# Patient Record
Sex: Male | Born: 1970 | Race: White | Hispanic: No | Marital: Single | Smoking: Never smoker
Health system: Southern US, Community
[De-identification: ages and names within clinical notes are randomized; demographics above are authoritative.]

## PROBLEM LIST (undated history)

## (undated) DIAGNOSIS — R7303 Prediabetes: Secondary | ICD-10-CM

## (undated) DIAGNOSIS — G4730 Sleep apnea, unspecified: Secondary | ICD-10-CM

## (undated) DIAGNOSIS — G2581 Restless legs syndrome: Secondary | ICD-10-CM

## (undated) DIAGNOSIS — R072 Precordial pain: Secondary | ICD-10-CM

## (undated) DIAGNOSIS — F419 Anxiety disorder, unspecified: Secondary | ICD-10-CM

## (undated) DIAGNOSIS — R002 Palpitations: Secondary | ICD-10-CM

## (undated) DIAGNOSIS — I10 Essential (primary) hypertension: Secondary | ICD-10-CM

## (undated) DIAGNOSIS — N401 Enlarged prostate with lower urinary tract symptoms: Secondary | ICD-10-CM

## (undated) DIAGNOSIS — Z87442 Personal history of urinary calculi: Secondary | ICD-10-CM

## (undated) DIAGNOSIS — M7522 Bicipital tendinitis, left shoulder: Secondary | ICD-10-CM

## (undated) DIAGNOSIS — F329 Major depressive disorder, single episode, unspecified: Secondary | ICD-10-CM

## (undated) DIAGNOSIS — F32A Depression, unspecified: Secondary | ICD-10-CM

## (undated) DIAGNOSIS — M1990 Unspecified osteoarthritis, unspecified site: Secondary | ICD-10-CM

## (undated) DIAGNOSIS — M75102 Unspecified rotator cuff tear or rupture of left shoulder, not specified as traumatic: Secondary | ICD-10-CM

## (undated) DIAGNOSIS — G4733 Obstructive sleep apnea (adult) (pediatric): Secondary | ICD-10-CM

## (undated) HISTORY — PX: HIP ARTHROSCOPY: SUR88

## (undated) HISTORY — PX: LITHOTRIPSY: SUR834

## (undated) HISTORY — PX: WISDOM TOOTH EXTRACTION: SHX21

## (undated) HISTORY — PX: EXTRACORPOREAL SHOCK WAVE LITHOTRIPSY: SHX1557

---

## 2001-06-17 ENCOUNTER — Emergency Department (HOSPITAL_COMMUNITY): Admission: EM | Admit: 2001-06-17 | Discharge: 2001-06-17 | Payer: Self-pay | Admitting: *Deleted

## 2001-06-17 ENCOUNTER — Encounter: Payer: Self-pay | Admitting: Emergency Medicine

## 2004-07-04 ENCOUNTER — Ambulatory Visit: Payer: Self-pay | Admitting: Internal Medicine

## 2014-06-13 NOTE — H&P (Signed)
  [facility] [address]   [address] [place], [place] [id number] [phone] A Division of [facility]  Loreta Aveaniel F. Charmel Pronovost, M.D.   Robert A. Thurston HoleWainer, M.D.   Burnell BlanksW. Dan Caffrey, M.D.   Eulas PostJoshua P. Landau, M.D.   Lunette StandsAnna Voytek, M.D Jewel Baizeimothy D. Eulah PontMurphy, M.D.  Buford DresserWesley R. Ibazebo, M.D.  Estell HarpinJames S. Kramer, M.D.    Melina Fiddlerebecca S. Bassett, M.D. Mary L. Isidoro DonningAnton, PA-C  Kirstin A. Shepperson, PA-C  Josh Brodheadhadwell, PA-C [place] Parry, [place]A-C   RE: [name] SlaterHollis, [name]   [id number]      DOB: 1971-03-06 PROGRESS NOTE: 06-06-14 Reason for visit:  Evaluation of right hip arthritis. History of present illness: This is a 43 year old gentleman who has had pain in his right hip for quite a while. He had an arthroscopy 10+ years ago. He has had multiple ultrasound guided injections to his right hip which helped but the last one did not provide much relief. He originally took Celebrex which worked well but he developed a rash with Celebrex and then tried Mobic and also got a rash with this.  He is unable to put on his socks or tie his shoes at this point. Please see associated documentation for this clinic visit for further past medical, family, surgical and social history, review of systems, and exam findings as this was reviewed by me.  EXAMINATION: Well appearing male in no apparent distress. Right lower extremity has significantly decreased range of motion positive Stinchfield and negative straight leg raise. He is neurovascularly intact.  IMAGING: Previous x-rays reviewed by me demonstrate severe joint space narrowing of the right hip, moderate of the left hip.     ASSESSMENT/PLAN: He has bilateral hip arthritis, left is asymptomatic, right has been recalcitrant to multiple injections. He's unable to take NSAID's. I had a very lengthy discussion with him of the risks and benefits of total hip arthroplasty he would like to think about this. He will follow-up as  needed.  Jewel Baizeimothy D.  Eulah PontMurphy, M.D.  Electronically verified by Jewel Baizeimothy D. Eulah PontMurphy, M.D. TDM:kah D 06-07-14 T 06-08-14

## 2014-06-21 NOTE — Pre-Procedure Instructions (Signed)
Arliss JourneySteven S Prestia  06/21/2014   Your procedure is scheduled on:  Tuesday July 05, 2014 at 2:00 PM.  [facility] 12:00 PM.  Call this number if you have problems the morning of surgery: [phone]   Call this number if you have any questions prior to surgery: [phone]   Remember:   Do not eat food or drink liquids after midnight.   Take these medicines the morning of surgery with A SIP OF WATER: NONE   Do not wear jewelry.  Do not wear lotions, powders, or cologne.   Men may shave face and neck.  Do not bring valuables to the hospital.  [facility] is not responsible for any belongings or valuables.               Contacts, dentures or bridgework may not be worn into surgery.  Leave suitcase in the car. After surgery it may be brought to your room.  For patients admitted to the hospital, discharge time is determined by your treatment team.               Patients discharged the day of surgery will not be allowed to drive home.  Name and phone number of your driver:   Special Instructions: Shower using CHG soap the night before and the morning of your surgery   Please read over the following fact sheets that you were given: Pain Booklet, Coughing and Deep Breathing, Blood Transfusion Information, Total Joint Packet, MRSA Information and Surgical Site Infection Prevention

## 2014-06-22 ENCOUNTER — Encounter (HOSPITAL_COMMUNITY)
Admission: RE | Admit: 2014-06-22 | Discharge: 2014-06-22 | Disposition: A | Discharge status: Home / Self Care | Payer: BC Managed Care – PPO | Source: Ambulatory Visit | Attending: Orthopedic Surgery | Admitting: Orthopedic Surgery

## 2014-06-22 ENCOUNTER — Encounter (HOSPITAL_COMMUNITY): Payer: Self-pay

## 2014-06-22 DIAGNOSIS — I10 Essential (primary) hypertension: Secondary | ICD-10-CM | POA: Insufficient documentation

## 2014-06-22 DIAGNOSIS — G4730 Sleep apnea, unspecified: Secondary | ICD-10-CM | POA: Diagnosis not present

## 2014-06-22 DIAGNOSIS — Z01818 Encounter for other preprocedural examination: Secondary | ICD-10-CM | POA: Diagnosis present

## 2014-06-22 HISTORY — DX: Sleep apnea, unspecified: G47.30

## 2014-06-22 HISTORY — DX: Restless legs syndrome: G25.81

## 2014-06-22 HISTORY — DX: Depression, unspecified: F32.A

## 2014-06-22 HISTORY — DX: Essential (primary) hypertension: I10

## 2014-06-22 HISTORY — DX: Anxiety disorder, unspecified: F41.9

## 2014-06-22 HISTORY — DX: Unspecified osteoarthritis, unspecified site: M19.90

## 2014-06-22 HISTORY — DX: Major depressive disorder, single episode, unspecified: F32.9

## 2014-06-22 HISTORY — DX: Personal history of urinary calculi: Z87.442

## 2014-06-22 LAB — PROTIME-INR
INR: 0.93 (ref 0.00–1.49)
PROTHROMBIN TIME: 12.5 s (ref 11.6–15.2)

## 2014-06-22 LAB — CBC
HEMATOCRIT: 46.7 % (ref 39.0–52.0)
HEMOGLOBIN: 15.8 g/dL (ref 13.0–17.0)
MCH: 28.6 pg (ref 26.0–34.0)
MCHC: 33.8 g/dL (ref 30.0–36.0)
MCV: 84.6 fL (ref 78.0–100.0)
Platelets: 206 10*3/uL (ref 150–400)
RBC: 5.52 MIL/uL (ref 4.22–5.81)
RDW: 13.8 % (ref 11.5–15.5)
WBC: 9.1 10*3/uL (ref 4.0–10.5)

## 2014-06-22 LAB — BASIC METABOLIC PANEL
Anion gap: 12 (ref 5–15)
BUN: 14 mg/dL (ref 6–23)
CHLORIDE: 103 meq/L (ref 96–112)
CO2: 24 meq/L (ref 19–32)
CREATININE: 0.91 mg/dL (ref 0.50–1.35)
Calcium: 9.2 mg/dL (ref 8.4–10.5)
GFR calc Af Amer: >90 mL/min (ref >90)
GFR calc non Af Amer: >90 mL/min (ref >90)
GLUCOSE: 104 mg/dL — AB (ref 70–99)
POTASSIUM: 4.3 meq/L (ref 3.7–5.3)
Sodium: 139 mEq/L (ref 137–147)

## 2014-06-22 LAB — URINALYSIS, ROUTINE W REFLEX MICROSCOPIC
Bilirubin Urine: NEGATIVE
Glucose, UA: NEGATIVE mg/dL
Hgb urine dipstick: NEGATIVE
KETONES UR: NEGATIVE mg/dL
LEUKOCYTES UA: NEGATIVE
NITRITE: NEGATIVE
PH: 5 (ref 5.0–8.0)
Protein, ur: NEGATIVE mg/dL
SPECIFIC GRAVITY, URINE: 1.022 (ref 1.005–1.030)
Urobilinogen, UA: 0.2 mg/dL (ref 0.0–1.0)

## 2014-06-22 LAB — TYPE AND SCREEN
ABO/RH(D): O POS
Antibody Screen: NEGATIVE

## 2014-06-22 LAB — ABO/RH: ABO/RH(D): O POS

## 2014-06-22 LAB — SURGICAL PCR SCREEN
MRSA, PCR: NEGATIVE
Staphylococcus aureus: NEGATIVE

## 2014-06-22 NOTE — Progress Notes (Signed)
PCP is Sherry Ryter-Brown. Patient denied having a stress test or cardiac cath, but informed Nurse that he has sleep apnea and wears CPAP at bedtime. Patient was instructed to bring CPAP mask DOS. Patient verbalized understanding. Patient denied having any cardiac or pulmonary issues.

## 2014-06-23 LAB — URINE CULTURE
COLONY COUNT: NO GROWTH
Culture: NO GROWTH

## 2014-07-04 MED ORDER — TRANEXAMIC ACID 100 MG/ML IV SOLN
1000.0000 mg | INTRAVENOUS | Status: DC
  Filled 2014-07-04: qty 10

## 2014-07-04 MED ORDER — CEFAZOLIN SODIUM-DEXTROSE 2-3 GM-% IV SOLR
2.0000 g | INTRAVENOUS | Status: AC
  Administered 2014-07-05: 2 g via INTRAVENOUS
  Administered 2014-07-05: 1 g via INTRAVENOUS
  Filled 2014-07-04: qty 50

## 2014-07-04 MED ORDER — ACETAMINOPHEN 500 MG PO TABS
1000.0000 mg | ORAL_TABLET | Freq: Once | ORAL | Status: AC
Start: 2014-07-04 — End: 2014-07-05
  Administered 2014-07-05: 1000 mg via ORAL
  Filled 2014-07-04: qty 2

## 2014-07-05 ENCOUNTER — Ambulatory Visit (HOSPITAL_COMMUNITY): Payer: BC Managed Care – PPO | Admitting: Anesthesiology

## 2014-07-05 ENCOUNTER — Inpatient Hospital Stay (HOSPITAL_COMMUNITY)
Admission: RE | Admit: 2014-07-05 | Discharge: 2014-07-06 | DRG: 470 | Disposition: A | Discharge status: Home / Self Care | Payer: BC Managed Care – PPO | Source: Ambulatory Visit | Attending: Orthopedic Surgery | Admitting: Orthopedic Surgery

## 2014-07-05 ENCOUNTER — Encounter (HOSPITAL_COMMUNITY): Payer: Self-pay | Admitting: *Deleted

## 2014-07-05 ENCOUNTER — Inpatient Hospital Stay (HOSPITAL_COMMUNITY): Payer: BC Managed Care – PPO

## 2014-07-05 ENCOUNTER — Encounter (HOSPITAL_COMMUNITY)
Admission: RE | Disposition: A | Discharge status: Home / Self Care | Payer: Self-pay | Source: Ambulatory Visit | Attending: Orthopedic Surgery

## 2014-07-05 DIAGNOSIS — M1611 Unilateral primary osteoarthritis, right hip: Secondary | ICD-10-CM | POA: Diagnosis present

## 2014-07-05 DIAGNOSIS — Z6841 Body Mass Index (BMI) 40.0 and over, adult: Secondary | ICD-10-CM

## 2014-07-05 DIAGNOSIS — F419 Anxiety disorder, unspecified: Secondary | ICD-10-CM | POA: Diagnosis present

## 2014-07-05 DIAGNOSIS — G4730 Sleep apnea, unspecified: Secondary | ICD-10-CM | POA: Diagnosis present

## 2014-07-05 DIAGNOSIS — F329 Major depressive disorder, single episode, unspecified: Secondary | ICD-10-CM | POA: Diagnosis present

## 2014-07-05 DIAGNOSIS — Z9889 Other specified postprocedural states: Secondary | ICD-10-CM

## 2014-07-05 DIAGNOSIS — G2581 Restless legs syndrome: Secondary | ICD-10-CM | POA: Diagnosis present

## 2014-07-05 DIAGNOSIS — I10 Essential (primary) hypertension: Secondary | ICD-10-CM | POA: Diagnosis present

## 2014-07-05 DIAGNOSIS — E669 Obesity, unspecified: Secondary | ICD-10-CM | POA: Diagnosis present

## 2014-07-05 DIAGNOSIS — Z87442 Personal history of urinary calculi: Secondary | ICD-10-CM | POA: Diagnosis not present

## 2014-07-05 DIAGNOSIS — M1990 Unspecified osteoarthritis, unspecified site: Secondary | ICD-10-CM | POA: Diagnosis present

## 2014-07-05 HISTORY — PX: TOTAL HIP ARTHROPLASTY: SHX124

## 2014-07-05 SURGERY — ARTHROPLASTY, HIP, TOTAL, ANTERIOR APPROACH
Anesthesia: General | Site: Hip | Laterality: Right

## 2014-07-05 MED ORDER — 0.9 % SODIUM CHLORIDE (POUR BTL) OPTIME
TOPICAL | Status: DC | PRN
  Administered 2014-07-05: 1000 mL

## 2014-07-05 MED ORDER — LACTATED RINGERS IV SOLN
INTRAVENOUS | Status: DC
  Administered 2014-07-05: 13:00:00 via INTRAVENOUS

## 2014-07-05 MED ORDER — SUCCINYLCHOLINE CHLORIDE 20 MG/ML IJ SOLN
INTRAMUSCULAR | Status: DC | PRN
  Administered 2014-07-05: 120 mg via INTRAVENOUS

## 2014-07-05 MED ORDER — ROPINIROLE HCL 1 MG PO TABS
5.0000 mg | ORAL_TABLET | Freq: Every day | ORAL | Status: DC
  Administered 2014-07-05: 1 mg via ORAL
  Filled 2014-07-05: qty 5

## 2014-07-05 MED ORDER — DEXTROSE-NACL 5-0.45 % IV SOLN
INTRAVENOUS | Status: DC
  Administered 2014-07-05: 20:00:00 via INTRAVENOUS

## 2014-07-05 MED ORDER — METHOCARBAMOL 500 MG PO TABS
500.0000 mg | ORAL_TABLET | Freq: Four times a day (QID) | ORAL | Status: DC | PRN
  Administered 2014-07-05 – 2014-07-06 (×3): 500 mg via ORAL
  Filled 2014-07-05 (×2): qty 1

## 2014-07-05 MED ORDER — ACETAMINOPHEN 650 MG RE SUPP: 650.0000 mg | Freq: Four times a day (QID) | RECTAL | Status: DC | PRN

## 2014-07-05 MED ORDER — MIDAZOLAM HCL 5 MG/5ML IJ SOLN
INTRAMUSCULAR | Status: DC | PRN
  Administered 2014-07-05 (×2): 1 mg via INTRAVENOUS

## 2014-07-05 MED ORDER — SODIUM CHLORIDE 0.9 % IV SOLN
1000.0000 mg | INTRAVENOUS | Status: DC | PRN
  Administered 2014-07-05: 1000 mg via INTRAVENOUS

## 2014-07-05 MED ORDER — ONDANSETRON HCL 4 MG/2ML IJ SOLN
4.0000 mg | Freq: Four times a day (QID) | INTRAMUSCULAR | Status: DC | PRN
  Administered 2014-07-05: 4 mg via INTRAVENOUS
  Filled 2014-07-05: qty 2

## 2014-07-05 MED ORDER — SERTRALINE HCL 100 MG PO TABS
100.0000 mg | ORAL_TABLET | Freq: Every day | ORAL | Status: DC
  Administered 2014-07-06 (×2): 100 mg via ORAL
  Filled 2014-07-05 (×2): qty 1

## 2014-07-05 MED ORDER — ROCURONIUM BROMIDE 100 MG/10ML IV SOLN
INTRAVENOUS | Status: DC | PRN
  Administered 2014-07-05: 50 mg via INTRAVENOUS

## 2014-07-05 MED ORDER — HYDROMORPHONE HCL 1 MG/ML IJ SOLN
INTRAMUSCULAR | Status: AC
  Administered 2014-07-05: 0.5 mg via INTRAVENOUS
  Filled 2014-07-05: qty 1

## 2014-07-05 MED ORDER — ONDANSETRON HCL 4 MG PO TABS
4.0000 mg | ORAL_TABLET | Freq: Four times a day (QID) | ORAL | Status: DC | PRN
  Administered 2014-07-06: 4 mg via ORAL
  Filled 2014-07-05: qty 1

## 2014-07-05 MED ORDER — ACETAMINOPHEN 325 MG PO TABS: 650.0000 mg | ORAL_TABLET | Freq: Four times a day (QID) | ORAL | Status: DC | PRN

## 2014-07-05 MED ORDER — ONDANSETRON HCL 4 MG/2ML IJ SOLN
INTRAMUSCULAR | Status: DC | PRN
  Administered 2014-07-05: 4 mg via INTRAVENOUS

## 2014-07-05 MED ORDER — OXYCODONE HCL 5 MG PO TABS
5.0000 mg | ORAL_TABLET | Freq: Once | ORAL | Status: AC | PRN
  Administered 2014-07-05: 5 mg via ORAL

## 2014-07-05 MED ORDER — HYDROCODONE-ACETAMINOPHEN 5-325 MG PO TABS
1.0000 | ORAL_TABLET | ORAL | Status: DC | PRN
  Administered 2014-07-05 – 2014-07-06 (×4): 2 via ORAL
  Filled 2014-07-05 (×4): qty 2

## 2014-07-05 MED ORDER — LIDOCAINE HCL (CARDIAC) 20 MG/ML IV SOLN
INTRAVENOUS | Status: DC | PRN
  Administered 2014-07-05: 80 mg via INTRAVENOUS

## 2014-07-05 MED ORDER — PROPOFOL 10 MG/ML IV BOLUS
INTRAVENOUS | Status: AC
  Filled 2014-07-05: qty 20

## 2014-07-05 MED ORDER — CEFAZOLIN SODIUM 1-5 GM-% IV SOLN
INTRAVENOUS | Status: AC
  Filled 2014-07-05: qty 50

## 2014-07-05 MED ORDER — HYDROMORPHONE HCL 1 MG/ML IJ SOLN
0.2500 mg | INTRAMUSCULAR | Status: DC | PRN
  Administered 2014-07-05 (×4): 0.5 mg via INTRAVENOUS

## 2014-07-05 MED ORDER — GLYCOPYRROLATE 0.2 MG/ML IJ SOLN
INTRAMUSCULAR | Status: DC | PRN
  Administered 2014-07-05: .8 mg via INTRAVENOUS

## 2014-07-05 MED ORDER — ONDANSETRON HCL 4 MG/2ML IJ SOLN
INTRAMUSCULAR | Status: AC
  Filled 2014-07-05: qty 2

## 2014-07-05 MED ORDER — MENTHOL 3 MG MT LOZG: 1.0000 | LOZENGE | OROMUCOSAL | Status: DC | PRN

## 2014-07-05 MED ORDER — DOCUSATE SODIUM 100 MG PO CAPS
100.0000 mg | ORAL_CAPSULE | Freq: Two times a day (BID) | ORAL | Status: DC
  Administered 2014-07-05 – 2014-07-06 (×2): 100 mg via ORAL
  Filled 2014-07-05 (×2): qty 1

## 2014-07-05 MED ORDER — DEXTROSE 5 % IV SOLN
INTRAVENOUS | Status: DC | PRN
  Administered 2014-07-05: 16:00:00 via INTRAVENOUS

## 2014-07-05 MED ORDER — RIVAROXABAN 20 MG PO TABS
10.0000 mg | ORAL_TABLET | Freq: Every day | ORAL | Status: DC
  Administered 2014-07-06: 10 mg via ORAL
  Filled 2014-07-05: qty 1

## 2014-07-05 MED ORDER — NEOSTIGMINE METHYLSULFATE 10 MG/10ML IV SOLN
INTRAVENOUS | Status: DC | PRN
  Administered 2014-07-05: 5 mg via INTRAVENOUS

## 2014-07-05 MED ORDER — ROCURONIUM BROMIDE 50 MG/5ML IV SOLN
INTRAVENOUS | Status: AC
  Filled 2014-07-05: qty 1

## 2014-07-05 MED ORDER — METHYLPREDNISOLONE (PAK) 4 MG PO TABS: ORAL_TABLET | ORAL | Status: DC

## 2014-07-05 MED ORDER — GLYCOPYRROLATE 0.2 MG/ML IJ SOLN
INTRAMUSCULAR | Status: AC
  Filled 2014-07-05: qty 4

## 2014-07-05 MED ORDER — DOCUSATE SODIUM 100 MG PO CAPS: 100.0000 mg | ORAL_CAPSULE | Freq: Two times a day (BID) | ORAL | Status: DC

## 2014-07-05 MED ORDER — ONDANSETRON HCL 4 MG/2ML IJ SOLN: 4.0000 mg | Freq: Four times a day (QID) | INTRAMUSCULAR | Status: DC | PRN

## 2014-07-05 MED ORDER — DEXTROSE-NACL 5-0.45 % IV SOLN: 100.0000 mL/h | INTRAVENOUS | Status: DC

## 2014-07-05 MED ORDER — METOCLOPRAMIDE HCL 5 MG PO TABS: 5.0000 mg | ORAL_TABLET | Freq: Three times a day (TID) | ORAL | Status: DC | PRN

## 2014-07-05 MED ORDER — OXYCODONE HCL 5 MG/5ML PO SOLN: 5.0000 mg | Freq: Once | ORAL | Status: AC | PRN

## 2014-07-05 MED ORDER — MIDAZOLAM HCL 2 MG/2ML IJ SOLN
INTRAMUSCULAR | Status: AC
  Filled 2014-07-05: qty 2

## 2014-07-05 MED ORDER — CLONAZEPAM 0.5 MG PO TABS: 0.5000 mg | ORAL_TABLET | Freq: Two times a day (BID) | ORAL | Status: DC | PRN

## 2014-07-05 MED ORDER — SIMVASTATIN 40 MG PO TABS
40.0000 mg | ORAL_TABLET | Freq: Every day | ORAL | Status: DC
  Administered 2014-07-06 (×2): 40 mg via ORAL
  Filled 2014-07-05 (×2): qty 1

## 2014-07-05 MED ORDER — ONDANSETRON HCL 4 MG PO TABS: 4.0000 mg | ORAL_TABLET | Freq: Three times a day (TID) | ORAL | Status: DC | PRN

## 2014-07-05 MED ORDER — LACTATED RINGERS IV SOLN
INTRAVENOUS | Status: DC | PRN
Start: 2014-07-05 — End: 2014-07-05
  Administered 2014-07-05 (×2): via INTRAVENOUS

## 2014-07-05 MED ORDER — HYDROMORPHONE HCL 1 MG/ML IJ SOLN
INTRAMUSCULAR | Status: DC | PRN
  Administered 2014-07-05 (×2): 0.5 mg via INTRAVENOUS

## 2014-07-05 MED ORDER — RIVAROXABAN 10 MG PO TABS: 10.0000 mg | ORAL_TABLET | Freq: Every day | ORAL | Status: DC

## 2014-07-05 MED ORDER — BUPIVACAINE LIPOSOME 1.3 % IJ SUSP
20.0000 mL | Freq: Once | INTRAMUSCULAR | Status: AC
  Administered 2014-07-05: 20 mL
  Filled 2014-07-05: qty 20

## 2014-07-05 MED ORDER — METOCLOPRAMIDE HCL 5 MG/ML IJ SOLN: 5.0000 mg | Freq: Three times a day (TID) | INTRAMUSCULAR | Status: DC | PRN

## 2014-07-05 MED ORDER — CHLORHEXIDINE GLUCONATE 4 % EX LIQD
60.0000 mL | Freq: Once | CUTANEOUS | Status: DC
  Filled 2014-07-05: qty 60

## 2014-07-05 MED ORDER — OXYCODONE HCL 5 MG PO TABS
ORAL_TABLET | ORAL | Status: AC
  Filled 2014-07-05: qty 1

## 2014-07-05 MED ORDER — CEFAZOLIN SODIUM-DEXTROSE 2-3 GM-% IV SOLR
2.0000 g | Freq: Four times a day (QID) | INTRAVENOUS | Status: AC
  Administered 2014-07-06 (×2): 2 g via INTRAVENOUS
  Filled 2014-07-05 (×2): qty 50

## 2014-07-05 MED ORDER — MIDAZOLAM HCL 2 MG/2ML IJ SOLN
1.0000 mg | INTRAMUSCULAR | Status: DC | PRN
  Administered 2014-07-05: 2 mg via INTRAVENOUS
  Filled 2014-07-05: qty 2

## 2014-07-05 MED ORDER — METHOCARBAMOL 500 MG PO TABS
ORAL_TABLET | ORAL | Status: AC
  Administered 2014-07-05: 500 mg via ORAL
  Filled 2014-07-05: qty 1

## 2014-07-05 MED ORDER — FENTANYL CITRATE 0.05 MG/ML IJ SOLN
INTRAMUSCULAR | Status: DC | PRN
  Administered 2014-07-05: 100 ug via INTRAVENOUS
  Administered 2014-07-05: 50 ug via INTRAVENOUS
  Administered 2014-07-05 (×2): 100 ug via INTRAVENOUS
  Administered 2014-07-05: 50 ug via INTRAVENOUS
  Administered 2014-07-05: 100 ug via INTRAVENOUS

## 2014-07-05 MED ORDER — HYDROMORPHONE HCL 1 MG/ML IJ SOLN
INTRAMUSCULAR | Status: AC
  Filled 2014-07-05: qty 1

## 2014-07-05 MED ORDER — PHENOL 1.4 % MT LIQD: 1.0000 | OROMUCOSAL | Status: DC | PRN

## 2014-07-05 MED ORDER — FENTANYL CITRATE 0.05 MG/ML IJ SOLN
INTRAMUSCULAR | Status: AC
  Filled 2014-07-05: qty 5

## 2014-07-05 MED ORDER — DEXAMETHASONE SODIUM PHOSPHATE 10 MG/ML IJ SOLN
10.0000 mg | Freq: Once | INTRAMUSCULAR | Status: AC
  Administered 2014-07-06: 10 mg via INTRAVENOUS
  Filled 2014-07-05: qty 1

## 2014-07-05 MED ORDER — PROPOFOL 10 MG/ML IV BOLUS
INTRAVENOUS | Status: DC | PRN
  Administered 2014-07-05: 250 mg via INTRAVENOUS

## 2014-07-05 MED ORDER — METHOCARBAMOL 1000 MG/10ML IJ SOLN
500.0000 mg | Freq: Four times a day (QID) | INTRAVENOUS | Status: DC | PRN
  Filled 2014-07-05: qty 5

## 2014-07-05 MED ORDER — HYDROCODONE-ACETAMINOPHEN 5-325 MG PO TABS: 1.0000 | ORAL_TABLET | ORAL | Status: DC | PRN

## 2014-07-05 MED ORDER — MORPHINE SULFATE 2 MG/ML IJ SOLN: 2.0000 mg | INTRAMUSCULAR | Status: DC | PRN

## 2014-07-05 MED ORDER — LIDOCAINE HCL (CARDIAC) 20 MG/ML IV SOLN
INTRAVENOUS | Status: AC
  Filled 2014-07-05: qty 5

## 2014-07-05 SURGICAL SUPPLY — 64 items
BLADE SAW SGTL 18X1.27X75 (BLADE) ×2 IMPLANT
BLADE SURG ROTATE 9660 (MISCELLANEOUS) IMPLANT
CAPT HIP TOTAL 2 ×1 IMPLANT
COVER SURGICAL LIGHT HANDLE (MISCELLANEOUS) ×2 IMPLANT
DRAPE C-ARM 42X72 X-RAY (DRAPES) ×1 IMPLANT
DRAPE IMP U-DRAPE 54X76 (DRAPES) ×4 IMPLANT
DRAPE INCISE IOBAN 66X45 STRL (DRAPES) ×2 IMPLANT
DRAPE ORTHO SPLIT 77X108 STRL (DRAPES) ×6
DRAPE PROXIMA HALF (DRAPES) ×2 IMPLANT
DRAPE SURG 17X23 STRL (DRAPES) ×2 IMPLANT
DRAPE SURG ORHT 6 SPLT 77X108 (DRAPES) ×2 IMPLANT
DRAPE U-SHAPE 47X51 STRL (DRAPES) ×3 IMPLANT
DRSG AQUACEL AG ADV 3.5X10 (GAUZE/BANDAGES/DRESSINGS) ×1 IMPLANT
DRSG MEPILEX BORDER 4X8 (GAUZE/BANDAGES/DRESSINGS) ×1 IMPLANT
DURAPREP 26ML APPLICATOR (WOUND CARE) ×2 IMPLANT
ELECT BLADE 4.0 EZ CLEAN MEGAD (MISCELLANEOUS) ×2
ELECT CAUTERY BLADE 6.4 (BLADE) ×2 IMPLANT
ELECT REM PT RETURN 9FT ADLT (ELECTROSURGICAL) ×2
ELECTRODE BLDE 4.0 EZ CLN MEGD (MISCELLANEOUS) ×1 IMPLANT
ELECTRODE REM PT RTRN 9FT ADLT (ELECTROSURGICAL) ×1 IMPLANT
FACESHIELD WRAPAROUND (MASK) ×4 IMPLANT
FACESHIELD WRAPAROUND OR TEAM (MASK) ×2 IMPLANT
GLOVE BIO SURGEON STRL SZ7 (GLOVE) ×1 IMPLANT
GLOVE BIO SURGEON STRL SZ7.5 (GLOVE) ×2 IMPLANT
GLOVE BIO SURGEONS STER SZ 5.5 (GLOVE) ×1 IMPLANT
GLOVE BIOGEL M 7.0 STRL (GLOVE) IMPLANT
GLOVE BIOGEL PI IND STRL 6.5 (GLOVE) IMPLANT
GLOVE BIOGEL PI IND STRL 7.5 (GLOVE) IMPLANT
GLOVE BIOGEL PI IND STRL 8 (GLOVE) ×2 IMPLANT
GLOVE BIOGEL PI INDICATOR 6.5 (GLOVE) ×1
GLOVE BIOGEL PI INDICATOR 7.5 (GLOVE)
GLOVE BIOGEL PI INDICATOR 8 (GLOVE) ×2
GLOVE BIOGEL PI ORTHO PRO SZ8 (GLOVE) ×2
GLOVE PI ORTHO PRO STRL SZ8 (GLOVE) IMPLANT
GLOVE SURG ORTHO 8.0 STRL STRW (GLOVE) ×3 IMPLANT
GOWN STRL REUS W/ TWL LRG LVL3 (GOWN DISPOSABLE) ×3 IMPLANT
GOWN STRL REUS W/ TWL XL LVL3 (GOWN DISPOSABLE) ×1 IMPLANT
GOWN STRL REUS W/TWL 2XL LVL3 (GOWN DISPOSABLE) ×1 IMPLANT
GOWN STRL REUS W/TWL LRG LVL3 (GOWN DISPOSABLE) ×2
GOWN STRL REUS W/TWL XL LVL3 (GOWN DISPOSABLE) ×2
KIT BASIN OR (CUSTOM PROCEDURE TRAY) ×2 IMPLANT
KIT ROOM TURNOVER OR (KITS) ×2 IMPLANT
MANIFOLD NEPTUNE II (INSTRUMENTS) ×2 IMPLANT
NDL 18GX1X1/2 (RX/OR ONLY) (NEEDLE) ×1 IMPLANT
NDL SAFETY ECLIPSE 18X1.5 (NEEDLE) ×1 IMPLANT
NEEDLE 18GX1X1/2 (RX/OR ONLY) (NEEDLE) ×2 IMPLANT
NEEDLE HYPO 18GX1.5 SHARP (NEEDLE) ×2
NS IRRIG 1000ML POUR BTL (IV SOLUTION) ×2 IMPLANT
PACK TOTAL JOINT (CUSTOM PROCEDURE TRAY) ×2 IMPLANT
PACK UNIVERSAL I (CUSTOM PROCEDURE TRAY) ×2 IMPLANT
PAD ARMBOARD 7.5X6 YLW CONV (MISCELLANEOUS) ×5 IMPLANT
SPONGE LAP 18X18 X RAY DECT (DISPOSABLE) IMPLANT
SUT MNCRL AB 4-0 PS2 18 (SUTURE) ×2 IMPLANT
SUT MON AB 2-0 CT1 36 (SUTURE) ×2 IMPLANT
SUT VIC AB 0 CT1 27 (SUTURE) ×2
SUT VIC AB 0 CT1 27XBRD ANBCTR (SUTURE) ×1 IMPLANT
SUT VIC AB 1 CT1 27 (SUTURE) ×2
SUT VIC AB 1 CT1 27XBRD ANBCTR (SUTURE) ×1 IMPLANT
SYR 50ML LL SCALE MARK (SYRINGE) ×2 IMPLANT
TOWEL OR 17X24 6PK STRL BLUE (TOWEL DISPOSABLE) ×2 IMPLANT
TOWEL OR 17X26 10 PK STRL BLUE (TOWEL DISPOSABLE) ×2 IMPLANT
TOWEL OR NON WOVEN STRL DISP B (DISPOSABLE) ×1 IMPLANT
TRAY FOLEY CATH 16FRSI W/METER (SET/KITS/TRAYS/PACK) IMPLANT
WATER STERILE IRR 1000ML POUR (IV SOLUTION) ×1 IMPLANT

## 2014-07-05 NOTE — Interval H&P Note (Signed)
History and Physical Interval Note:  07/05/2014 7:13 AM  [name] Pacheco  has presented today for surgery, with the diagnosis of oa right hip  The various methods of treatment have been discussed with the patient and family. After consideration of risks, benefits and other options for treatment, the patient has consented to  Procedure(s): RIGHT TOTAL HIP ARTHROPLASTY ANTERIOR APPROACH (Right) as a surgical intervention .  The patient's history has been reviewed, patient examined, no change in status, stable for surgery.  I have reviewed the patient's chart and labs.  Questions were answered to the patient's satisfaction.     Toy Samarin, D

## 2014-07-05 NOTE — Transfer of Care (Signed)
Immediate Anesthesia Transfer of Care Note  Patient: [name] Pacheco  Procedure(s) Performed: Procedure(s): RIGHT TOTAL HIP ARTHROPLASTY ANTERIOR APPROACH (Right)  Patient Location: PACU  Anesthesia Type:General  Level of Consciousness: awake, alert , oriented and patient cooperative  Airway & Oxygen Therapy: Patient Spontanous Breathing and Patient connected to nasal cannula oxygen  Post-op Assessment: Report given to PACU RN and Post -op Vital signs reviewed and stable  Post vital signs: Reviewed and stable  Complications: No apparent anesthesia complications

## 2014-07-05 NOTE — Progress Notes (Signed)
Orthopedic Tech Progress Note Patient Details:  [name] JourneySteven S Pacheco [birth year] [id number] Patient weight exceeds limit on overhead frame. Patient ID: [name] Pacheco, male   DOB: [birth year], 43 y.o.   MRN: [id number]   [name] Pacheco, [name] Pacheco 07/05/2014, 9:32 PM

## 2014-07-05 NOTE — Op Note (Signed)
07/05/2014  5:55 PM  PATIENT:  [name] Pacheco   MRN: [id number]  PRE-OPERATIVE DIAGNOSIS:  oa right hip  POST-OPERATIVE DIAGNOSIS:  oa right hip  PROCEDURE:  Procedure(s): RIGHT TOTAL HIP ARTHROPLASTY ANTERIOR APPROACH  PREOPERATIVE INDICATIONS:    [name] Pacheco is an 43 y.o. male who has a diagnosis of <principal problem not specified> and elected for surgical management after failing conservative treatment.  The risks benefits and alternatives were discussed with the patient including but not limited to the risks of nonoperative treatment, versus surgical intervention including infection, bleeding, nerve injury, periprosthetic fracture, the need for revision surgery, dislocation, leg length discrepancy, blood clots, cardiopulmonary complications, morbidity, mortality, among others, and they were willing to proceed.     OPERATIVE REPORT     SURGEON:   Renette Butters, MD    ASSISTANT:  Joya Gaskins, OPA, He was necessary for efficiency and safety of the case.     ANESTHESIA:  General    COMPLICATIONS:  None.     COMPONENTS:  Stryker acolade fit femur size 6 with a 36 mm +0 head ball and a PSL acetabular shell size 58 with a  polyethylene liner    PROCEDURE IN DETAIL:   The patient was met in the holding area and  identified.  The appropriate hip was identified and marked at the operative site.  The patient was then transported to the OR  and  placed under general anesthesia.  At that point, the patient was  placed in the supine position and  secured to the operating room table and all bony prominences padded. He received pre-operative antibiotics    The operative lower extremity was prepped from the iliac crest to the distal leg.  Sterile draping was performed.  Time out was performed prior to incision.      Skin incision was made just 2 cm lateral to the ASIS  extending in line with the tensor fascia lata. Electrocautery was used to control all bleeders. I dissected down  sharply to the fascia of the tensor fascia lata was confirmed that the muscle fibers beneath were running posteriorly. I then incised the fascia over the superficial tensor fascia lata in line with the incision. The fascia was elevated off the anterior aspect of the muscle the muscle was retracted posteriorly and protected throughout the case. I then used electrocautery to incise the tensor fascia lata fascia control and all bleeders. Immediately visible was the fat over top of the anterior neck and capsule.  I removed the anterior fat from the capsule and elevated the rectus muscle off of the anterior capsule. I then removed a large time of capsule. The retractors were then placed over the anterior acetabulum as well as around the superior and inferior neck.  I then removed a section of the femoral neck and a napkin ring fashion. Then used the power course to remove the femoral head from the acetabulum and thoroughly irrigated the acetabulum. I sized the femoral head.    I then exposed the deep acetabulum, cleared out any tissue including the ligamentum teres.   After adequate visualization, I excised the labrum, and then sequentially reamed.  I placed the trial acetabulum, which seated nicely, and then impacted the real cup into place.  Appropriate version and inclination was confirmed clinically matching their bony anatomy, and also with the use transverse acetabular ligament.  I placed 2 screws in the posterior superior region.   I then placed the polyethylene liner in place  I then abducted the leg and released the external rotators from the posterior femur allowing it to be easily delivered up lateral and anterior to the acetabulum for preparation of the femoral canal.    I then prepared the proximal femur using the cookie-cutter and then sequentially reamed and broached.  A trial broach, neck, and head was utilized, and I reduced the hip and it was found to have excellent stability with  functional range of motion..  I then impacted the real femoral prosthesis into place into the appropriate version, slightly anteverted to the normal anatomy, and I impacted the real head ball into place. The hip was then reduced and taken through functional range of motion and found to have excellent stability. Leg lengths were restored.  I then irrigated the hip copiously again with, and repaired the fascia with Vicryl, followed by monocryl for the subcutaneous tissue, Monocryl for the skin, Steri-Strips and sterile gauze. The wounds were injected. The patient was then awakened and returned to PACU in stable and satisfactory condition. There were no complications.  POST OPERATIVE PLAN: WBAT, DVT px: SCD's/TED and Xarelto  Edmonia Lynch, MD Orthopedic Surgeon [phone]   This note was generated using a template and dragon dictation system. In light of that, I have reviewed the note and all aspects of it are applicable to this case. Any dictation errors are due to the computerized dictation system.

## 2014-07-05 NOTE — Anesthesia Preprocedure Evaluation (Signed)
Anesthesia Evaluation  Patient identified by MRN, date of birth, ID band Patient awake    Reviewed: Allergy & Precautions, H&P , NPO status , Patient's Chart, lab work & pertinent test results  Airway Mallampati: II   Neck ROM: full    Dental   Pulmonary sleep apnea and Continuous Positive Airway Pressure Ventilation ,          Cardiovascular hypertension,     Neuro/Psych Anxiety Depression    GI/Hepatic   Endo/Other  Morbid obesity  Renal/GU      Musculoskeletal  (+) Arthritis -,   Abdominal   Peds  Hematology   Anesthesia Other Findings   Reproductive/Obstetrics                             Anesthesia Physical Anesthesia Plan  ASA: II  Anesthesia Plan: General   Post-op Pain Management:    Induction: Intravenous  Airway Management Planned: Oral ETT  Additional Equipment:   Intra-op Plan:   Post-operative Plan: Extubation in OR  Informed Consent: I have reviewed the patients History and Physical, chart, labs and discussed the procedure including the risks, benefits and alternatives for the proposed anesthesia with the patient or authorized representative who has indicated his/her understanding and acceptance.     Plan Discussed with: CRNA, Anesthesiologist and Surgeon  Anesthesia Plan Comments:         Anesthesia Quick Evaluation

## 2014-07-05 NOTE — Anesthesia Procedure Notes (Signed)
Procedure Name: Intubation Date/Time: 07/05/2014 4:00 PM Performed by: Marni GriffonJAMES, [name] B Pre-anesthesia Checklist: Patient identified, Emergency Drugs available, Suction available and Patient being monitored Patient Re-evaluated:Patient Re-evaluated prior to inductionOxygen Delivery Method: Circle system utilized Preoxygenation: Pre-oxygenation with 100% oxygen Intubation Type: Inhalational induction Ventilation: Mask ventilation without difficulty Laryngoscope Size: Mac and 4 Grade View: Grade III Tube type: Oral Tube size: 8.0 mm Number of attempts: 1 Airway Equipment and Method: Stylet (extra foam padding under upper back) Placement Confirmation: breath sounds checked- equal and bilateral and positive ETCO2 (saw only base of cords) Secured at: 22 (cm at teeth) cm Tube secured with: Tape Dental Injury: Teeth and Oropharynx as per pre-operative assessment

## 2014-07-06 MED ORDER — ROPINIROLE HCL 1 MG PO TABS: 1.0000 mg | ORAL_TABLET | Freq: Every day | ORAL | Status: DC

## 2014-07-06 NOTE — Progress Notes (Signed)
Patient is discharged from room 4N28 at this time. Alert and in stable condition. IV site d/c'd. Instructions read to patient and wife and understanding verbalized. Left unit via wheelchair with all belongings.

## 2014-07-06 NOTE — Progress Notes (Signed)
UR COMPLETED  

## 2014-07-06 NOTE — Progress Notes (Signed)
Occupational Therapy Evaluation Patient Details Name: [name] Pacheco MRN: [id number] DOB: [birth year] Today's Date: 07/06/2014    History of Present Illness Adm 07/05/14 for Rt THA (direct anterior) PMhx- sleep apnea, OA   Clinical Impression   Patient is s/p above surgery resulting in functional limitations due to the deficits listed below (see OT problem list). Pt drowsy from pain medication received just prior to OT arrival. Pt completed shower and toilet transfer with min guard (A). Pt and wife are concerned about getting in and out of bed, LB dressing, and showering safely at home. Pt and wife educated on AE, bed mobility, safe transfers, safe footwear, and bathroom safety. Patient will benefit from skilled OT acutely to increase independence and safety with ADLS to allow discharge home.    Follow Up Recommendations  Home health OT;Supervision/Assistance - 24 hour    Equipment Recommendations  Other (comment) Lexicographer(Reacher (pt will purchase))    Recommendations for Other Services       Precautions / Restrictions Precautions Precautions: None Restrictions Weight Bearing Restrictions: Yes RLE Weight Bearing: Weight bearing as tolerated      Mobility Bed Mobility Overal bed mobility: Needs Assistance Bed Mobility: Rolling;Sit to Sidelying Rolling: Min guard Sidelying to sit: Supervision     Sit to sidelying: Min assist General bed mobility comments: HOB flat; use of bedrails. Pt asking about how to get in/out of bed. Educated pt on side-lying and rolling. After performing log roll technique, pt indicated it was painful. Educated pt and wife about sitting on foot of bed at home and using UE to scoot hips and bil LE backwards onto bed as an alternative method.  Transfers Overall transfer level: Needs assistance Equipment used: Rolling walker (2 wheeled) Transfers: Sit to/from Stand Sit to Stand: Supervision         General transfer comment: Good demo of hand placement.  Supervision for safety    Balance Overall balance assessment: Needs assistance Sitting-balance support: No upper extremity supported;Feet supported Sitting balance-Leahy Scale: Good     Standing balance support: No upper extremity supported;During functional activity Standing balance-Leahy Scale: Fair Standing balance comment: Requires RW for bil UE . Able to maintain balance for short period of time without UE support                            ADL Overall ADL's : Needs assistance/impaired     Grooming: Wash/dry hands;Supervision/safety;Standing               Lower Body Dressing: Min guard;With adaptive equipment;Cueing for sequencing;Sitting/lateral leans   Toilet Transfer: Min guard;Cueing for safety;Ambulation;BSC;RW   Toileting- ArchitectClothing Manipulation and Hygiene: Min guard;Sit to/from stand   Tub/ Shower Transfer: Walk-in shower;Min guard;Cueing for safety;Ambulation;Rolling walker   Functional mobility during ADLs: Min guard;Rolling walker General ADL Comments: Pt lethargic from pain meds given just prior to session. Pt completed shower and toilet transfer with min guard (A) for safety. Pt's wife educated and (A) during simulated shower transfer. Pt able to close eyes for 10 seconds without UE support and maintain balance. Pt educated and practiced with AE for LB adls, but only found reacher to be useful.     Vision                     Perception     Praxis      Pertinent Vitals/Pain Pain Assessment: 0-10 Pain Score: 8  Pain Location: R hip Pain  Intervention(s): Premedicated before session;Repositioned;Limited activity within patient's tolerance;Monitored during session     Hand Dominance Right   Extremity/Trunk Assessment Upper Extremity Assessment Upper Extremity Assessment: Overall WFL for tasks assessed   Lower Extremity Assessment Lower Extremity Assessment: RLE deficits/detail;Defer to PT evaluation   Cervical / Trunk  Assessment Cervical / Trunk Assessment: Normal   Communication Communication Communication: No difficulties   Cognition Arousal/Alertness: Lethargic;Suspect due to medications Behavior During Therapy: [facility] for tasks assessed/performed Overall Cognitive Status: Within Functional Limits for tasks assessed                     General Comments       Exercises       Shoulder Instructions      Home Living Family/patient expects to be discharged to:: Private residence                   Bathroom Shower/Tub: Walk-in shower;Door   Foot LockerBathroom Toilet: Handicapped height     Home Equipment: Shower seat - built in;Hand held shower head          Prior Functioning/Environment Level of Independence: Needs assistance             OT Diagnosis: Generalized weakness;Acute pain   OT Problem List: Decreased strength;Decreased range of motion;Decreased activity tolerance;Impaired balance (sitting and/or standing);Decreased coordination;Decreased safety awareness;Decreased knowledge of use of DME or AE;Impaired sensation;Pain   OT Treatment/Interventions: Self-care/ADL training;Therapeutic exercise;DME and/or AE instruction;Therapeutic activities;Patient/family education;Balance training    OT Goals(Current goals can be found in the care plan section) Acute Rehab OT Goals Patient Stated Goal: to go home OT Goal Formulation: With patient Time For Goal Achievement: 07/20/14 Potential to Achieve Goals: Good ADL Goals Pt Will Perform Lower Body Bathing: with adaptive equipment;sit to/from stand;with modified independence Pt Will Perform Lower Body Dressing: with modified independence;with adaptive equipment;sit to/from stand Pt Will Transfer to Toilet: with supervision;ambulating;bedside commode Pt Will Perform Toileting - Clothing Manipulation and hygiene: with supervision;sit to/from stand Pt Will Perform Tub/Shower Transfer: Shower transfer;with  supervision;ambulating;shower seat;rolling walker  OT Frequency: Min 2X/week   Barriers to D/C:            Co-evaluation              End of Session Equipment Utilized During Treatment: Gait belt;Rolling walker Nurse Communication: Mobility status;Weight bearing status  Activity Tolerance: Patient limited by lethargy (Due to pain medication) Patient left: in bed;with call bell/phone within reach;with family/visitor present   Time: 1327-1400 OT Time Calculation (min): 33 min Charges:    G-Codes:    Nils PyleBermel, [name] Pacheco 07/06/2014, 2:26 PM

## 2014-07-06 NOTE — Progress Notes (Signed)
Pt. Admitted to 4N via 2RNs from surgery. Reports no pain and VSS will continue to monitor. Suzy Bouchardhompson, Kimley Apsey E, [place]RN 07/05/2014 2131.

## 2014-07-06 NOTE — Discharge Summary (Signed)
Physician Discharge Summary  Patient ID: Arliss JourneySteven S Tetrault MRN: [id number] DOB/AGE: Jun 12, 1971 43 y.o.  Admit date: 07/05/2014 Discharge date: 07/06/2014  Admission Diagnoses:  <principal problem not specified>  Discharge Diagnoses:  Active Problems:   DJD (degenerative joint disease)   Past Medical History  Diagnosis Date  . Hypertension     pt used to have hypertension but was taken off of medication appro 2 years ago  . Sleep apnea     wears CPAP  . Anxiety   . Depression   . History of kidney stones   . Arthritis     bilateral hips  . Restless leg syndrome     Surgeries: Procedure(s): RIGHT TOTAL HIP ARTHROPLASTY ANTERIOR APPROACH on 07/05/2014   Consultants (if any):    Discharged Condition: Improved  Hospital Course: Arliss JourneySteven S Walbert is an 43 y.o. male who was admitted 07/05/2014 with a diagnosis of <principal problem not specified> and went to the operating room on 07/05/2014 and underwent the above named procedures.    He was given perioperative antibiotics:  Anti-infectives    Start     Dose/Rate Route Frequency Ordered Stop   07/05/14 2200  ceFAZolin (ANCEF) IVPB 2 g/50 mL premix     2 g100 mL/hr over 30 Minutes Intravenous Every 6 hours 07/05/14 2109 07/06/14 0602   07/05/14 0600  ceFAZolin (ANCEF) IVPB 2 g/50 mL premix     2 g100 mL/hr over 30 Minutes Intravenous On call to O.R. 07/04/14 1401 07/05/14 1702    .  He was given sequential compression devices, early ambulation, and Xarelto for DVT prophylaxis.  He benefited maximally from the hospital stay and there were no complications.    Recent vital signs:  Filed Vitals:   07/06/14 0638  BP: 120/61  Pulse: 97  Temp: 98.2 F (36.8 C)  Resp: 20    Recent laboratory studies:  Lab Results  Component Value Date   HGB 15.8 06/22/2014   Lab Results  Component Value Date   WBC 9.1 06/22/2014   PLT 206 06/22/2014   Lab Results  Component Value Date   INR 0.93 06/22/2014   Lab Results   Component Value Date   NA 139 06/22/2014   K 4.3 06/22/2014   CL 103 06/22/2014   CO2 24 06/22/2014   BUN 14 06/22/2014   CREATININE 0.91 06/22/2014   GLUCOSE 104* 06/22/2014    Discharge Medications:     Medication List    TAKE these medications        clonazePAM 0.5 MG tablet  Commonly known as:  KLONOPIN  Take 0.5 mg by mouth 2 (two) times daily as needed for anxiety.     docusate sodium 100 MG capsule  Commonly known as:  COLACE  Take 1 capsule (100 mg total) by mouth 2 (two) times daily. Continue this while taking narcotics to help with bowel movements     glucosamine-chondroitin 500-400 MG tablet  Take 1 tablet by mouth daily.     HYDROcodone-acetaminophen 5-325 MG per tablet  Commonly known as:  NORCO  Take 1-2 tablets by mouth every 4 (four) hours as needed for moderate pain.     MAGNESIUM PO  Take 1,000 mg by mouth daily.     methylPREDNIsolone 4 MG tablet  Commonly known as:  MEDROL DOSPACK  - follow package directions  -   - One medrol dose pak     ondansetron 4 MG tablet  Commonly known as:  ZOFRAN  Take 1 tablet (  4 mg total) by mouth every 8 (eight) hours as needed for nausea.     rivaroxaban 10 MG Tabs tablet  Commonly known as:  XARELTO  Take 1 tablet (10 mg total) by mouth daily.     ropinirole 5 MG tablet  Commonly known as:  REQUIP  Take 1 mg by mouth at bedtime.     rOPINIRole 0.5 MG tablet  Commonly known as:  REQUIP  Take 0.5 mg by mouth at bedtime.     sertraline 100 MG tablet  Commonly known as:  ZOLOFT  Take 100 mg by mouth daily.     simvastatin 40 MG tablet  Commonly known as:  ZOCOR  Take 40 mg by mouth daily.        Diagnostic Studies: Dg Pelvis Portable  07/05/2014   CLINICAL DATA:  Postop right hip  EXAM: PORTABLE PELVIS 1-2 VIEWS  COMPARISON:  None.  FINDINGS: Right hip arthroplasty in satisfactory position.  Visualized bony pelvis appears intact.  Mild degenerative changes of the left hip  IMPRESSION: Right hip  arthroplasty in satisfactory position.   Electronically Signed   By: Charline BillsSriyesh  Krishnan M.D.   On: 07/05/2014 19:48    Disposition: Final discharge disposition not confirmed        Follow-up Information    Follow up with Sheral ApleyMURPHY, TIMOTHY, D, MD.   Specialty:  Orthopedic Surgery   Why:  as scheduled   Contact information:   [address] [place] [id number] [phone]        Signed: Margarita RanaMURPHY, TIMOTHY, D 07/06/2014, 8:13 AM

## 2014-07-06 NOTE — Progress Notes (Signed)
Physical Therapy Treatment Patient Details Name: [name] JourneySteven S Ferrara MRN: [id number] DOB: 11-Oct-1970 Today's Date: 07/06/2014    History of Present Illness Adm 07/05/14 for Rt THA (direct anterior) PMhx- sleep apnea, OA    PT Comments    Patient able to complete stair training this afternoon. Reviewed standing therex. Patient with slightly increased pain but moving overall very well. Patient safe to D/C from a mobility standpoint based on progression towards goals set on PT eval.    Follow Up Recommendations  Home health PT;Supervision for mobility/OOB     Equipment Recommendations  Rolling walker with 5" wheels    Recommendations for Other Services OT consult     Precautions / Restrictions Precautions Precautions: None Restrictions Weight Bearing Restrictions: Yes RLE Weight Bearing: Weight bearing as tolerated    Mobility  Bed Mobility Overal bed mobility: Needs Assistance Bed Mobility: Rolling;Sidelying to Sit Rolling: Supervision Sidelying to sit: Supervision       General bed mobility comments: cues for position  Transfers Overall transfer level: Needs assistance Equipment used: Rolling walker (2 wheeled) Transfers: Sit to/from Stand Sit to Stand: Supervision         General transfer comment: Sup for safety. Patient with safe technique  Ambulation/Gait Ambulation/Gait assistance: Supervision Ambulation Distance (Feet): 300 Feet Assistive device: Rolling walker (2 wheeled) Gait Pattern/deviations: Step-through pattern;Decreased stride length;Antalgic     General Gait Details: Continued with step through although antalgic. Good safe use of RW   Stairs Stairs: Yes Stairs assistance: Min guard Stair Management: Step to pattern;Forwards;Two rails Number of Stairs: 5 General stair comments: Cues for sequency and technique  Wheelchair Mobility    Modified Rankin (Stroke Patients Only)       Balance                                     Cognition Arousal/Alertness: Awake/alert Behavior During Therapy: WFL for tasks assessed/performed Overall Cognitive Status: Within Functional Limits for tasks assessed                      Exercises Total Joint Exercises Ankle Circles/Pumps: AROM;Both;20 reps;Supine Quad Sets: AROM;Right;10 reps;Supine Heel Slides: AAROM;Right;10 reps;Supine Hip ABduction/ADduction: AAROM;Right;10 reps;Supine    General Comments General comments (skin integrity, edema, etc.): wife present with numerous questions; discussed sitting surfaces/chairs at home (very soft with wide armrests) including alternate use of dining room chair and/or adding 2-3" foam from craft store to seat of chair      Pertinent Vitals/Pain Pain Assessment: 0-10 Pain Score: 7  Pain Location: R hip Pain Intervention(s): Monitored during session;RN gave pain meds during session    Home Living Family/patient expects to be discharged to:: Private residence Living Arrangements: Spouse/significant other;Children (dtr 43 yo) Available Help at Discharge: Family;Available 24 hours/day Type of Home: House Home Access: Stairs to enter Entrance Stairs-Rails: Right;Left;Can reach both Home Layout: Two level;Able to live on main level with bedroom/bathroom (bonus room upstairs) Home Equipment: None      Prior Function Level of Independence: Needs assistance  Gait / Transfers Assistance Needed: wife assisted with bed mobility; independent transfers (with effort) and gait   Comments: self-employed; video production   PT Goals (current goals can now be found in the care plan section) Acute Rehab PT Goals Patient Stated Goal: 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) PT Goal Formulation: With patient Time For Goal Achievement: 07/07/14 Potential to  Achieve Goals: Good Progress towards PT goals: Progressing toward goals    Frequency  7X/week    PT Plan Current plan remains appropriate    Co-evaluation              End of Session Equipment Utilized During Treatment: Gait belt Activity Tolerance: Patient tolerated treatment well Patient left: in chair;with call bell/phone within reach     Time: 1306-1330 PT Time Calculation (min) (ACUTE ONLY): 24 min  Charges:  $Gait Training: 23-37 mins $Therapeutic Exercise: 8-22 mins $Therapeutic Activity: 8-22 mins                    G Codes:      Fredrich BirksRobinette, Julia Elizabeth 07/06/2014, 1:41 PM 07/06/2014 Fredrich Birksobinette, Julia Elizabeth PTA [phone] pager [phone] office

## 2014-07-06 NOTE — Evaluation (Signed)
Physical Therapy Evaluation Patient Details Name: [name] Pacheco MRN: [id number] DOB: 1971-04-24 Today's Date: 07/06/2014   History of Present Illness  Adm 07/05/14 for Rt THA (direct anterior) PMhx- sleep apnea, OA  Clinical Impression  Pt is s/p THA resulting in the deficits listed below (see PT Problem List).  Pt will benefit from skilled PT to increase their independence and safety with mobility to allow discharge to the venue listed below. Will plan to see again this pm for stair training with likely d/c home today.       Follow Up Recommendations Home health PT;Supervision for mobility/OOB    Equipment Recommendations  Rolling walker with 5" wheels    Recommendations for Other Services OT consult     Precautions / Restrictions Precautions Precautions: None Restrictions Weight Bearing Restrictions: Yes RLE Weight Bearing: Weight bearing as tolerated      Mobility  Bed Mobility Overal bed mobility: Needs Assistance Bed Mobility: Rolling;Sidelying to Sit Rolling: Supervision Sidelying to sit: Min guard       General bed mobility comments: pt attempted supine to sit with inability to push up torso; educated on rolling to his Lt and side to sit with guarding for safety  Transfers Overall transfer level: Needs assistance Equipment used: Rolling walker (2 wheeled) Transfers: Sit to/from Stand Sit to Stand: Min guard         General transfer comment: vc for safe use of RW; Sup for safety  Ambulation/Gait Ambulation/Gait assistance: Min guard Ambulation Distance (Feet): 140 Feet Assistive device: Rolling walker (2 wheeled) Gait Pattern/deviations: Step-through pattern;Decreased stride length;Antalgic     General Gait Details: vc for safe use of RW; progressed from step-to to step through pattern; slightly antalgic after 100 ft  Stairs            Wheelchair Mobility    Modified Rankin (Stroke Patients Only)       Balance                                             Pertinent Vitals/Pain Pain Assessment: 0-10 Pain Score: 4  Pain Location: Rt hip Pain Intervention(s): Limited activity within patient's tolerance;Monitored during session;Repositioned;Patient requesting pain meds-RN notified;RN gave pain meds during session    Home Living Family/patient expects to be discharged to:: Private residence Living Arrangements: Spouse/significant other;Children (dtr 43 yo) Available Help at Discharge: Family;Available 24 hours/day Type of Home: House Home Access: Stairs to enter Entrance Stairs-Rails: Right;Left;Can reach both Entrance Stairs-Number of Steps: 5 Home Layout: Two level;Able to live on main level with bedroom/bathroom (bonus room upstairs) Home Equipment: None      Prior Function Level of Independence: Needs assistance   Gait / Transfers Assistance Needed: wife assisted with bed mobility; independent transfers (with effort) and gait     Comments: self-employed; [employer] production     Hand Dominance   Dominant Hand: Right    Extremity/Trunk Assessment   Upper Extremity Assessment: Overall WFL for tasks assessed           Lower Extremity Assessment: RLE deficits/detail RLE Deficits / Details: AAROM to 90 hip flexion, otherwise WNL; hip strength limited by pain    Cervical / Trunk Assessment: Normal  Communication   Communication: No difficulties  Cognition Arousal/Alertness: Awake/alert Behavior During Therapy: WFL for tasks assessed/performed Overall Cognitive Status: Within Functional Limits for tasks assessed  General Comments General comments (skin integrity, edema, etc.): wife present with numerous questions; discussed sitting surfaces/chairs at home (very soft with wide armrests) including alternate use of dining room chair and/or adding 2-3" foam from craft store to seat of chair    Exercises Total Joint Exercises Ankle Circles/Pumps: AROM;Both;20  reps;Supine Quad Sets: AROM;Right;10 reps;Supine Heel Slides: AAROM;Right;10 reps;Supine Hip ABduction/ADduction: AAROM;Right;10 reps;Supine      Assessment/Plan    PT Assessment Patient needs continued PT services  PT Diagnosis Difficulty walking;Acute pain   PT Problem List Decreased strength;Decreased range of motion;Decreased activity tolerance;Decreased balance;Decreased mobility;Decreased knowledge of use of DME;Pain;Obesity  PT Treatment Interventions DME instruction;Gait training;Stair training;Functional mobility training;Therapeutic activities;Therapeutic exercise;Patient/family education   PT Goals (Current goals can be found in the Care Plan section) Acute Rehab PT Goals Patient Stated Goal: 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) PT Goal Formulation: With patient Time For Goal Achievement: 07/07/14 Potential to Achieve Goals: Good    Frequency 7X/week   Barriers to discharge        Co-evaluation               End of Session Equipment Utilized During Treatment: Gait belt Activity Tolerance: Patient tolerated treatment well Patient left: in chair;with call bell/phone within reach;with family/visitor present Nurse Communication: Mobility status (will need 2nd PT session; likely d/c after that)         Time: 0908-1004 PT Time Calculation (min) (ACUTE ONLY): 56 min   Charges:   PT Evaluation $Initial PT Evaluation Tier I: 1 Procedure PT Treatments $Gait Training: 8-22 mins $Therapeutic Exercise: 8-22 mins $Therapeutic Activity: 8-22 mins   PT G Codes:          Keeanna Villafranca 07/06/2014, 10:20 AM Pager [phone]

## 2014-07-06 NOTE — Clinical Social Work Note (Signed)
CSW Consult Acknowledged:   CSW received consult for SNF placement. CSW reviewed chart. Per PT's note the pt can transition home PT. CSW has signed off.   Aixa Corsello, MSW, LCSWA [phone]

## 2014-07-06 NOTE — Discharge Instructions (Signed)
Keep your incision dry and covered until follow up  Bear weight as tolerated  Information on my medicine - XARELTO (Rivaroxaban)  This medication education was reviewed with me or my healthcare representative as part of my discharge preparation.  The pharmacist that spoke with me during my hospital stay was:  Fredrik RiggerMarkle, Shawna Wearing Sue, [facility]PH  Why was Xarelto prescribed for you? Xarelto was prescribed for you to reduce the risk of blood clots forming after orthopedic surgery. The medical term for these abnormal blood clots is venous thromboembolism (VTE).  What do you need to know about xarelto ? Take your Xarelto ONCE DAILY at the same time every day. You may take it either with or without food.  If you have difficulty swallowing the tablet whole, you may crush it and mix in applesauce just prior to taking your dose.  Take Xarelto exactly as prescribed by your doctor and DO NOT stop taking Xarelto without talking to the doctor who prescribed the medication.  Stopping without other VTE prevention medication to take the place of Xarelto may increase your risk of developing a clot.  After discharge, you should have regular check-up appointments with your healthcare provider that is prescribing your Xarelto.    What do you do if you miss a dose? If you miss a dose, take it as soon as you remember on the same day then continue your regularly scheduled once daily regimen the next day. Do not take two doses of Xarelto on the same day.   Important Safety Information A possible side effect of Xarelto is bleeding. You should call your healthcare provider right away if you experience any of the following: ? Bleeding from an injury or your nose that does not stop. ? Unusual colored urine (red or dark brown) or unusual colored stools (red or black). ? Unusual bruising for unknown reasons. ? A serious fall or if you hit your head (even if there is no bleeding).  Some medicines may interact with  Xarelto and might increase your risk of bleeding while on Xarelto. To help avoid this, consult your healthcare provider or pharmacist prior to using any new prescription or non-prescription medications, including herbals, vitamins, non-steroidal anti-inflammatory drugs (NSAIDs) and supplements.  This website has more information on Xarelto: [email or web address].xarelto.com.

## 2014-07-06 NOTE — Progress Notes (Signed)
Pt complaining of numbness to right leg on hip and knee. Pt able to feel pressure to leg. Pt with full sensation to right foot, 2+ pulses, cap refill less than 3 seconds, foot cool to touch, appropriate color. Pt able to move toes on right foot.  Pt states he has had numbness since he woke up on the floor tonight.  Orthopedic MD notified. No new orders. Will continue to monitor.

## 2014-07-06 NOTE — Progress Notes (Signed)
HHC choices offered to pt/ spouse, [facility]; [facility]; Abelino DerrickB Jinger Middlesworth RN,BSN,MHA [phone]

## 2014-07-06 NOTE — Plan of Care (Signed)
Problem: Phase I Progression Outcomes Goal: Pain controlled with appropriate interventions Outcome: Completed/Met Date Met:  07/06/14 Pain controlled on PO pain medicine Goal: OOB as tolerated unless otherwise ordered Outcome: Completed/Met Date Met:  07/06/14 Pt OOB with 1 assist to bathroom

## 2014-07-06 NOTE — Progress Notes (Signed)
[facility] called for DME, rolling walker and 3:1 to be delivered to the patient's room prior to discharge home today; CM talked to patient and spouse today about the new medication Xarelto, pt/ spouse wanted to know the co pay for the medication; Benefit check in progressAlexis Pacheco; B [name] RN,BSN,MHA [phone]

## 2014-07-06 NOTE — Plan of Care (Signed)
Problem: Phase II Progression Outcomes Goal: Progress activity as tolerated unless otherwise ordered Outcome: Completed/Met Date Met:  07/06/14

## 2014-07-07 ENCOUNTER — Encounter (HOSPITAL_COMMUNITY): Payer: Self-pay | Admitting: Orthopedic Surgery

## 2014-07-13 NOTE — Anesthesia Postprocedure Evaluation (Signed)
Anesthesia Post Note  Patient: [name] Pacheco  Procedure(s) Performed: Procedure(s) (LRB): RIGHT TOTAL HIP ARTHROPLASTY ANTERIOR APPROACH (Right)  Anesthesia type: General  Patient location: PACU  Post pain: Pain level controlled and Adequate analgesia  Post assessment: Post-op Vital signs reviewed, Patient's Cardiovascular Status Stable, Respiratory Function Stable, Patent Airway and Pain level controlled  Last Vitals:  Filed Vitals:   07/06/14 1412  BP:   Pulse:   Temp: 37.6 C  Resp:     Post vital signs: Reviewed and stable  Level of consciousness: awake, alert  and oriented  Complications: No apparent anesthesia complications

## 2018-05-21 ENCOUNTER — Encounter: Payer: Self-pay | Admitting: Emergency Medicine

## 2018-05-21 DIAGNOSIS — F411 Generalized anxiety disorder: Secondary | ICD-10-CM

## 2018-05-21 DIAGNOSIS — G4700 Insomnia, unspecified: Secondary | ICD-10-CM

## 2018-06-02 ENCOUNTER — Encounter: Payer: Self-pay | Admitting: Psychiatry

## 2018-06-02 ENCOUNTER — Ambulatory Visit: Payer: BLUE CROSS/BLUE SHIELD | Admitting: Psychiatry

## 2018-06-02 DIAGNOSIS — G2581 Restless legs syndrome: Secondary | ICD-10-CM

## 2018-06-02 DIAGNOSIS — F411 Generalized anxiety disorder: Secondary | ICD-10-CM

## 2018-06-02 MED ORDER — CLONAZEPAM 0.5 MG PO TABS: 0.5000 mg | ORAL_TABLET | Freq: Two times a day (BID) | ORAL | 2 refills | Status: DC | PRN

## 2018-06-02 MED ORDER — ZOLPIDEM TARTRATE 10 MG PO TABS: 10.0000 mg | ORAL_TABLET | Freq: Every evening | ORAL | 3 refills | Status: DC | PRN

## 2018-06-02 MED ORDER — SERTRALINE HCL 100 MG PO TABS: 150.0000 mg | ORAL_TABLET | Freq: Every day | ORAL | 3 refills | Status: DC

## 2018-06-02 NOTE — Progress Notes (Signed)
ARLYN BUERKLE [id number] 1970/12/09 47 y.o.  Subjective:   Patient ID:  [name] Pacheco is a 47 y.o. (DOB [birth year]) male.  Chief Complaint:  Chief Complaint  Patient presents with  . Sleeping Problem  . Anxiety    HPI [name] Pacheco presents to the office today for follow-up of GAD, RLS, sleep. Overall doing well. Uses clonazepam and Ambien rarely except when travels.  CPAP helps a good bit. Patient reports stable mood and denies depressed or irritable moods.  Patient denies any recent difficulty with anxiety.  Patient denies difficulty with sleep initiation or maintenance. Denies appetite disturbance.  Patient reports that energy and motivation have been good.  Patient denies any difficulty with concentration.  Patient denies any suicidal ideation. Sleep 6-7 hours. Satisfied with meds.   Review of Systems:  Review of Systems  Constitutional: Positive for unexpected weight change.  Neurological: Negative for tremors and weakness.  Psychiatric/Behavioral: Negative for agitation, behavioral problems, confusion, decreased concentration, dysphoric mood, hallucinations, self-injury, sleep disturbance and suicidal ideas. The patient is not nervous/anxious and is not hyperactive.     Medications: I have reviewed the patient's current medications.  Current Outpatient Medications  Medication Sig Dispense Refill  . clonazePAM (KLONOPIN) 0.5 MG tablet Take 1 tablet (0.5 mg total) by mouth 2 (two) times daily as needed for anxiety. 30 tablet 2  . rOPINIRole (REQUIP) 0.5 MG tablet Take 1 mg by mouth at bedtime.     . sertraline (ZOLOFT) 100 MG tablet Take 1.5 tablets (150 mg total) by mouth daily. 135 tablet 3  . simvastatin (ZOCOR) 40 MG tablet Take 40 mg by mouth daily.    . tamsulosin (FLOMAX) 0.4 MG CAPS capsule Take 0.4 mg by mouth daily.    Marland Kitchen zolpidem (AMBIEN) 10 MG tablet Take 1 tablet (10 mg total) by mouth at bedtime as needed for sleep. 30 tablet 3  . docusate sodium (COLACE)  100 MG capsule Take 1 capsule (100 mg total) by mouth 2 (two) times daily. Continue this while taking narcotics to help with bowel movements (Patient not taking: Reported on 06/02/2018) 30 capsule 1  . glucosamine-chondroitin 500-400 MG tablet Take 1 tablet by mouth daily.    Marland Kitchen MAGNESIUM PO Take 1,000 mg by mouth daily.     No current facility-administered medications for this visit.     Medication Side Effects: Other: yawns.  Not that often.  Allergies:  Allergies  Allergen Reactions  . Nsaids Rash    Past Medical History:  Diagnosis Date  . Anxiety   . Arthritis    bilateral hips  . Depression   . History of kidney stones   . Hypertension    pt used to have hypertension but was taken off of medication appro 2 years ago  . Restless leg syndrome   . Sleep apnea    wears CPAP    History reviewed. No pertinent family history.  Social History   Socioeconomic History  . Marital status: Single    Spouse name: Not on file  . Number of children: Not on file  . Years of education: Not on file  . Highest education level: Not on file  Occupational History  . Not on file  Social Needs  . Financial resource strain: Not on file  . Food insecurity:    Worry: Not on file    Inability: Not on file  . Transportation needs:    Medical: Not on file    Non-medical: Not on file  Tobacco Use  . Smoking status: Never Smoker  . Smokeless tobacco: Never Used  Substance and Sexual Activity  . Alcohol use: Yes    Comment: rare  . Drug use: No  . Sexual activity: Not on file  Lifestyle  . Physical activity:    Days per week: Not on file    Minutes per session: Not on file  . Stress: Not on file  Relationships  . Social connections:    Talks on phone: Not on file    Gets together: Not on file    Attends religious service: Not on file    Active member of club or organization: Not on file    Attends meetings of clubs or organizations: Not on file    Relationship status: Not on  file  . Intimate partner violence:    Fear of current or ex partner: Not on file    Emotionally abused: Not on file    Physically abused: Not on file    Forced sexual activity: Not on file  Other Topics Concern  . Not on file  Social History Narrative  . Not on file    Past Medical History, Surgical history, Social history, and Family history were reviewed and updated as appropriate.   Please see review of systems for further details on the patient's review from today.   Objective:   Physical Exam:  There were no vitals taken for this visit.  Physical Exam  Constitutional: He is oriented to person, place, and time. He appears well-developed. No distress.  Musculoskeletal: He exhibits no deformity.  Neurological: He is alert and oriented to person, place, and time. He displays no tremor. Coordination and gait normal.  Psychiatric: He has a normal mood and affect. His speech is normal and behavior is normal. Judgment and thought content normal. His mood appears not anxious. His affect is not angry, not blunt, not labile and not inappropriate. Cognition and memory are normal. He does not exhibit a depressed mood. He expresses no homicidal and no suicidal ideation. He expresses no suicidal plans and no homicidal plans.  Insight intact. No auditory or visual hallucinations. No delusions.  He is attentive.    Lab Review:     Component Value Date/Time   NA 139 06/22/2014 1031   K 4.3 06/22/2014 1031   CL 103 06/22/2014 1031   CO2 24 06/22/2014 1031   GLUCOSE 104 (H) 06/22/2014 1031   BUN 14 06/22/2014 1031   CREATININE 0.91 06/22/2014 1031   CALCIUM 9.2 06/22/2014 1031   GFRNONAA >90 06/22/2014 1031   GFRAA >90 06/22/2014 1031       Component Value Date/Time   WBC 9.1 06/22/2014 1031   RBC 5.52 06/22/2014 1031   HGB 15.8 06/22/2014 1031   HCT 46.7 06/22/2014 1031   PLT 206 06/22/2014 1031   MCV 84.6 06/22/2014 1031   MCH 28.6 06/22/2014 1031   MCHC 33.8 06/22/2014 1031    RDW 13.8 06/22/2014 1031    No results found for: POCLITH, LITHIUM   No results found for: PHENYTOIN, PHENOBARB, VALPROATE, CBMZ   .res Assessment: Plan:    Generalized anxiety disorder  Restless legs syndrome  Steve's symptoms of anxiety and restless legs are well controlled.  He is not having any side effects with the medication.  We discussed side effects.  He has been on sertraline 150 mg since late 2017 and has had good control and he does not want any changes.  He uses the clonazepam and  Ambien rarely.  We discussed dependence because he was concerned about that.  He is not using it frequently enough to have issues with dependence or withdrawal.  This is a 15-minute appointment  Follow-up 6 months  Meredith Staggers MD, DFAPA  Please see After Visit Summary for patient specific instructions.  No future appointments.  No orders of the defined types were placed in this encounter.     -------------------------------

## 2018-10-19 ENCOUNTER — Other Ambulatory Visit: Payer: Self-pay | Admitting: Orthopedic Surgery

## 2018-10-19 ENCOUNTER — Other Ambulatory Visit: Payer: BLUE CROSS/BLUE SHIELD

## 2018-10-19 DIAGNOSIS — M25512 Pain in left shoulder: Secondary | ICD-10-CM

## 2018-10-23 ENCOUNTER — Other Ambulatory Visit: Payer: BLUE CROSS/BLUE SHIELD

## 2018-12-03 ENCOUNTER — Ambulatory Visit: Payer: BLUE CROSS/BLUE SHIELD | Admitting: Psychiatry

## 2018-12-03 ENCOUNTER — Encounter: Payer: Self-pay | Admitting: Psychiatry

## 2018-12-03 ENCOUNTER — Other Ambulatory Visit: Payer: Self-pay

## 2018-12-03 DIAGNOSIS — F5105 Insomnia due to other mental disorder: Secondary | ICD-10-CM

## 2018-12-03 DIAGNOSIS — G2581 Restless legs syndrome: Secondary | ICD-10-CM | POA: Diagnosis not present

## 2018-12-03 DIAGNOSIS — F411 Generalized anxiety disorder: Secondary | ICD-10-CM

## 2018-12-03 MED ORDER — ROPINIROLE HCL 0.5 MG PO TABS: 1.0000 mg | ORAL_TABLET | Freq: Every day | ORAL | 1 refills | Status: DC

## 2018-12-03 MED ORDER — ZOLPIDEM TARTRATE 10 MG PO TABS: 10.0000 mg | ORAL_TABLET | Freq: Every evening | ORAL | 3 refills | Status: DC | PRN

## 2018-12-03 MED ORDER — SERTRALINE HCL 100 MG PO TABS: 150.0000 mg | ORAL_TABLET | Freq: Every day | ORAL | 3 refills | Status: DC

## 2018-12-03 MED ORDER — CLONAZEPAM 0.5 MG PO TABS: 0.5000 mg | ORAL_TABLET | Freq: Two times a day (BID) | ORAL | 5 refills | Status: AC | PRN

## 2018-12-03 NOTE — Progress Notes (Signed)
[name] JourneySteven S Kemppainen [id number] 1971/01/14 48 y.o.   Virtual Visit via Telephone Note  I connected with pt by telephone and verified that I am speaking with the correct person using two identifiers.   I discussed the limitations, risks, security and privacy concerns of performing an evaluation and management service by telephone and the availability of in person appointments. I also discussed with the patient that there may be a patient responsible charge related to this service. The patient expressed understanding and agreed to proceed.  I discussed the assessment and treatment plan with the patient. The patient was provided an opportunity to ask questions and all were answered. The patient agreed with the plan and demonstrated an understanding of the instructions.   The patient was advised to call back or seek an in-person evaluation if the symptoms worsen or if the condition fails to improve as anticipated.  I provided 15 minutes of non-face-to-face time during this encounter. The call started at 910 and ended at 925. The patient was located at home and the provider was located office.   Subjective:   Patient ID:  [name] JourneySteven S Samford is a 48 y.o. (DOB 1971/01/14) male.  Chief Complaint:  Chief Complaint  Patient presents with  . Follow-up    Medication management  . Anxiety    Increased   . Medication Refill    Requip    Anxiety  Patient reports no confusion, decreased concentration, nervous/anxious behavior or suicidal ideas.    Medication Refill  Pertinent negatives include no weakness.   [name] JourneySteven S Rainbow presents to the office today for follow-up of GAD, RLS, sleep.  Last seen November.  No meds changed.  Overall doing well. A little more anxiety with Covid.  Working from home.  As is wife and daughter.  Uses clonazepam and Ambien rarely except when travels.  CPAP helps a good bit. Patient reports stable mood and denies depressed or irritable moods.  Patient denies any recent  difficulty with anxiety except mild sx.  Patient denies difficulty with sleep initiation or maintenance. Denies appetite disturbance.  Patient reports that energy and motivation have been good.  Patient denies any difficulty with concentration.  Patient denies any suicidal ideation. Sleep 6-7 hours. Satisfied with meds.  Yawn a lot.  RLS been ok without ropinirole lately but wants it.  Walking more.  No recent clonazepam, nor Ambien but occ.   Review of Systems:  Review of Systems  Constitutional: Negative for unexpected weight change.  Cardiovascular:       Increase BP  Neurological: Negative for tremors and weakness.  Psychiatric/Behavioral: Negative for agitation, behavioral problems, confusion, decreased concentration, dysphoric mood, hallucinations, self-injury, sleep disturbance and suicidal ideas. The patient is not nervous/anxious and is not hyperactive.     Medications: I have reviewed the patient's current medications.  Current Outpatient Medications  Medication Sig Dispense Refill  . clonazePAM (KLONOPIN) 0.5 MG tablet Take 1 tablet (0.5 mg total) by mouth 2 (two) times daily as needed for anxiety. 30 tablet 5  . glucosamine-chondroitin 500-400 MG tablet Take 1 tablet by mouth daily.    Marland Kitchen. lisinopril (ZESTRIL) 10 MG tablet TAKE ONE TABLET (10 MG DOSE) BY MOUTH DAILY.    Marland Kitchen. MAGNESIUM PO Take 1,000 mg by mouth daily.    Marland Kitchen. rOPINIRole (REQUIP) 0.5 MG tablet Take 2 tablets (1 mg total) by mouth at bedtime. 180 tablet 1  . sertraline (ZOLOFT) 100 MG tablet Take 1.5 tablets (150 mg total) by mouth daily. 135 tablet 3  .  simvastatin (ZOCOR) 40 MG tablet Take 40 mg by mouth daily.    . tamsulosin (FLOMAX) 0.4 MG CAPS capsule Take 0.4 mg by mouth daily.    Marland Kitchen zolpidem (AMBIEN) 10 MG tablet Take 1 tablet (10 mg total) by mouth at bedtime as needed for sleep. 30 tablet 3   No current facility-administered medications for this visit.     Medication Side Effects: Other: yawns.  Not that  often.  Allergies:  Allergies  Allergen Reactions  . Celecoxib Itching  . Nsaids Rash    Past Medical History:  Diagnosis Date  . Anxiety   . Arthritis    bilateral hips  . Depression   . History of kidney stones   . Hypertension    pt used to have hypertension but was taken off of medication appro 2 years ago  . Restless leg syndrome   . Sleep apnea    wears CPAP    History reviewed. No pertinent family history.  Social History   Socioeconomic History  . Marital status: Single    Spouse name: Not on file  . Number of children: Not on file  . Years of education: Not on file  . Highest education level: Not on file  Occupational History  . Not on file  Social Needs  . Financial resource strain: Not on file  . Food insecurity:    Worry: Not on file    Inability: Not on file  . Transportation needs:    Medical: Not on file    Non-medical: Not on file  Tobacco Use  . Smoking status: Never Smoker  . Smokeless tobacco: Never Used  Substance and Sexual Activity  . Alcohol use: Yes    Comment: rare  . Drug use: No  . Sexual activity: Not on file  Lifestyle  . Physical activity:    Days per week: Not on file    Minutes per session: Not on file  . Stress: Not on file  Relationships  . Social connections:    Talks on phone: Not on file    Gets together: Not on file    Attends religious service: Not on file    Active member of club or organization: Not on file    Attends meetings of clubs or organizations: Not on file    Relationship status: Not on file  . Intimate partner violence:    Fear of current or ex partner: Not on file    Emotionally abused: Not on file    Physically abused: Not on file    Forced sexual activity: Not on file  Other Topics Concern  . Not on file  Social History Narrative  . Not on file    Past Medical History, Surgical history, Social history, and Family history were reviewed and updated as appropriate.   Please see review of  systems for further details on the patient's review from today.   Objective:   Physical Exam:  There were no vitals taken for this visit.  Physical Exam Neurological:     Mental Status: He is alert and oriented to person, place, and time.     Cranial Nerves: No dysarthria.  Psychiatric:        Attention and Perception: Attention normal.        Mood and Affect: Mood is anxious.        Speech: Speech normal.        Behavior: Behavior is cooperative.        Thought Content:  Thought content normal. Thought content is not paranoid or delusional. Thought content does not include homicidal or suicidal ideation. Thought content does not include homicidal or suicidal plan.        Cognition and Memory: Cognition and memory normal.        Judgment: Judgment normal.     Comments: Mild increase in anxiety with stress.     Lab Review:     Component Value Date/Time   NA 139 06/22/2014 1031   K 4.3 06/22/2014 1031   CL 103 06/22/2014 1031   CO2 24 06/22/2014 1031   GLUCOSE 104 (H) 06/22/2014 1031   BUN 14 06/22/2014 1031   CREATININE 0.91 06/22/2014 1031   CALCIUM 9.2 06/22/2014 1031   GFRNONAA >90 06/22/2014 1031   GFRAA >90 06/22/2014 1031       Component Value Date/Time   WBC 9.1 06/22/2014 1031   RBC 5.52 06/22/2014 1031   HGB 15.8 06/22/2014 1031   HCT 46.7 06/22/2014 1031   PLT 206 06/22/2014 1031   MCV 84.6 06/22/2014 1031   MCH 28.6 06/22/2014 1031   MCHC 33.8 06/22/2014 1031   RDW 13.8 06/22/2014 1031    No results found for: POCLITH, LITHIUM   No results found for: PHENYTOIN, PHENOBARB, VALPROATE, CBMZ   .res Assessment: Plan:    Generalized anxiety disorder  Restless legs syndrome  Insomnia due to mental condition  Steve's symptoms of anxiety and restless legs are well controlled except for a little worsening related to Covid but it is manageable.Marland Kitchen  He is not having any side effects with the medication.  We discussed side effects.  He has been on sertraline  150 mg since late 2017 and has had good control and he does not want any changes.  He uses the clonazepam and Ambien rarely.  We discussed dependence because he was concerned about that.  He is not using it frequently enough to have issues with dependence or withdrawal.  No med changes today  He is done okay off ropinirole for the last month.  However restless legs tends to wax and wane he may needed again in the future so the prescription was ordered.  We discussed the short-term risks associated with benzodiazepines including sedation and increased fall risk among others.  Discussed long-term side effect risk including dependence, potential withdrawal symptoms, and the potential eventual dose-related risk of dementia.  Follow-up 9 months because patient is stable  Meredith Staggers MD, DFAPA  Please see After Visit Summary for patient specific instructions.  Future Appointments  Date Time Provider Department Center  12/15/2018  9:50 AM [facility] MR 3 [facility]    No orders of the defined types were placed in this encounter.     -------------------------------

## 2018-12-15 ENCOUNTER — Ambulatory Visit
Admission: RE | Admit: 2018-12-15 | Discharge: 2018-12-15 | Disposition: A | Discharge status: Home / Self Care | Payer: BLUE CROSS/BLUE SHIELD | Source: Ambulatory Visit | Attending: Orthopedic Surgery | Admitting: Orthopedic Surgery

## 2018-12-15 ENCOUNTER — Other Ambulatory Visit: Payer: Self-pay

## 2018-12-15 DIAGNOSIS — M25512 Pain in left shoulder: Secondary | ICD-10-CM

## 2019-01-27 NOTE — H&P (Signed)
[facility] [address] [place], [place] [address] [phone] A Division of [facility]                                     RE: KEINO, PLACENCIA   [id number]   13-Dec-2070 12-18-18 Reason for visit: Continued evaluation of chronic left shoulder pain and weakness.   History of present illness: This is a chronic problem that has been going on for about six months insidiously without event.  He is having trouble lifting his arm, reaching behind his body and sleeping.  Most of his pain is anterolateral shoulder.  No numbness or paresthesias.  He has done home exercise therapies, taken over the counter medicines and had a Cortisone injection without significant lasting relief.    EXAMINATION: Well appearing and in no apparent distress.  Slightly reduced strength with rotator cuff testing, limited by pain.  Positive Hawkins.  Positive biceps pain.  Minimal AC pain.  Neurovascularly intact.  X-RAYS: MRI of his left shoulder performed on Dec 15, 2018 showed severe tendinosis of the supraspinatus tendon with insertional tear anteriorly and fraying along the bursal surface.  Mild tendinosis of the infraspinatus tendon and tendinosis of the intraarticular portion of the long head of the biceps tendon.  Subacromial/subdeltoid bursitis.   Previous x-rays of his left shoulder showed moderate AC arthropathy without significant glenohumeral abnormality.  No obvious acute abnormalities.  ASSESSMENT/PLAN: Chronic left shoulder pain refractory to above mentioned conservative measures.  He has an MRI showing anterior supraspinatus tear, biceps tendonitis, bursitis and AC arthropathy.  Details, risks and benefits of left shoulder arthroscopy, subacromial decompression, possible distal clavicle excision, rotator cuff repair and biceps tenodesis were discussed with the patient, along with the pre and postoperative course.  He verbalized  understanding and wishes to proceed.  He will follow up postoperatively.    Ernesta Amble.  Percell Miller, M.D.  Dictated by: Lemar Lofty, PA-C Electronically verified by Ernesta Amble Percell Miller, M.D. TDM(HCM):jjh D 12-19-18 T 12-24-18

## 2019-02-05 ENCOUNTER — Other Ambulatory Visit: Payer: Self-pay

## 2019-02-05 ENCOUNTER — Other Ambulatory Visit (HOSPITAL_COMMUNITY)
Admission: RE | Admit: 2019-02-05 | Discharge: 2019-02-05 | Disposition: A | Discharge status: Home / Self Care | Payer: BC Managed Care – PPO | Source: Ambulatory Visit | Attending: Orthopedic Surgery | Admitting: Orthopedic Surgery

## 2019-02-05 ENCOUNTER — Encounter (HOSPITAL_BASED_OUTPATIENT_CLINIC_OR_DEPARTMENT_OTHER): Payer: Self-pay | Admitting: *Deleted

## 2019-02-05 DIAGNOSIS — Z01812 Encounter for preprocedural laboratory examination: Secondary | ICD-10-CM | POA: Insufficient documentation

## 2019-02-05 DIAGNOSIS — Z1159 Encounter for screening for other viral diseases: Secondary | ICD-10-CM | POA: Diagnosis not present

## 2019-02-05 LAB — SARS CORONAVIRUS 2 (TAT 6-24 HRS): SARS Coronavirus 2: NEGATIVE

## 2019-02-05 NOTE — Progress Notes (Signed)

## 2019-02-05 NOTE — Progress Notes (Signed)
Called and lvm for kelly, or scheduler for dr Percell Miller, requested cardiac clearance for this pt to be faxed.  Noted in epic pt had cardiac office visit 01-16-2019 , stress echo and 30 day monitor ordered 01-18-2019 for palpitations/ precorial pain.

## 2019-02-05 NOTE — Progress Notes (Signed)
Spoke w/ [name] Pacheco via phone for pre-op interview.  Npo after mn w/ exception clear liquids until 0800 then nothing by mouth, [name] Pacheco verbalized understanding and also since has pre-diabetes do not drink the ensure pre-surgery drink he picked today at covid testing site.  Arrive at 0900.  Needs istat.  [name] Pacheco has current ekg w/ dr Mauricio Po , cardiologist.  Will request 12 lead tracing, stress echo result to be faxed.  Also, await cardiac clearance to be faxed from dr Percell Miller office, has been requested.  Currently [name] Pacheco states he is wearing a holter monitored until 02-11-2019, which is after surgery.   Chart to be reviewed by anesthesia.  [name] Pacheco denies chest pain but does have palpitations with flutter feeling.  [name] Pacheco was advised that he would need cardiac clearance and anesthesia review before surgery.

## 2019-02-08 NOTE — Progress Notes (Signed)
[name] Pacheco' cardiac clearance, EKG, and Stress test results given to [name]. for review.

## 2019-02-08 NOTE — Progress Notes (Signed)
Anesthesia Chart Review:  Pt is a same day work up for [facility].    Case: [id number] Date/Time: 02/09/19 1045   Procedure: LEFT SHOULDER ARTHROSCOPY WITH ROTATOR CUFF REPAIR AND SUBACROMIAL PARTIAL ACROMIOPLASTY, DISTAL CLAVICULECTOMY, EXTENSIVE DEBRIDEMENT BICEPS TENODENSIS (Left )   Anesthesia type: Choice   Pre-op diagnosis: LEFT SHOULDER OSTEOARTHRITIS   Location: [facility] OR ROOM 3 / [place]   Surgeon: Renette Butters, MD      DISCUSSION:  Pt is a 48 year old male with hx HTN, palpitations, OSA, prediabetes.   - Pt was seen in ED 12/24/18 for palpitations, chest pain (notes in care everywhere) - Saw cardiologist Lamar Blinks, MD 01/15/19 after ED visit (note in care everywhere).  Stress echo ordered (normal results below).  30 day event monitor also ordered- it will not be complete until 02/23/19.  Dr. Mauricio Po has given ok for pt to proceed with surgery 02/09/19 prior to completing 30 day event monitor.    PROVIDERS: -Primary care at [facility] [place] (see notes in care everywhere)   LABS: Will be obtained day of surgery    IMAGES:  CT angio chest 12/24/18 (care everywhere):  - No evidence of pulmonary emboli. - No acute infiltrate or pleural effusion.  1 view CXR 12/24/18 (care everywhere):  - No active disease.   EKG 12/24/18 [facility]- on paper chart): NSR.    CV:  Stress echo 01/18/19 [facility]): - Normal resting wall motion and no stress-induced wall motion abnormality. No inducible ischemia. Test terminated secondary to achieving diagnostic endpoint with 100% Max Peak HR achieved.  - LV normal in size. Mild concentric LVH with normal wall motion and EF 60-65%.  LV diastolic function normal - LA mildly dilated.  - Aortic valve not well visualized, but is grossly normal.    Past Medical History:  Diagnosis Date  . Anxiety   . Benign localized prostatic hyperplasia with lower urinary tract symptoms (LUTS)    . Biceps tendonitis on left   . Depression   . History of kidney stones   . Hypertension   . OA (osteoarthritis)    left hip, hands  . OSA on CPAP   . Palpitations    02-05-2019  currently pt was seen by dr Mauricio Po, cardiologist,  started wearing a 30 day monitor 01-18-2019 until 02-11-2019  . Pre-diabetes   . Precordial chest pain    02-05-2019 per pt had stress echo ordered by dr Mauricio Po, cardiologist,  01-18-2019 , pt does not know results  . Restless leg syndrome   . Rotator cuff tear, left     Past Surgical History:  Procedure Laterality Date  . EXTRACORPOREAL SHOCK WAVE LITHOTRIPSY  2013 approx  . HIP ARTHROSCOPY Right 2005 approx.  Marland Kitchen TOTAL HIP ARTHROPLASTY Right 07/05/2014   Procedure: RIGHT TOTAL HIP ARTHROPLASTY ANTERIOR APPROACH;  Surgeon: Renette Butters, MD;  Location: [facility];  Service: Orthopedics;  Laterality: Right;  . WISDOM TOOTH EXTRACTION      MEDICATIONS: No current facility-administered medications for this encounter.    . clonazePAM (KLONOPIN) 0.5 MG tablet  . lisinopril (ZESTRIL) 10 MG tablet  . Omega-3 Fatty Acids (FISH OIL) 1000 MG CAPS  . rOPINIRole (REQUIP) 0.5 MG tablet  . sertraline (ZOLOFT) 100 MG tablet  . simvastatin (ZOCOR) 40 MG tablet  . tamsulosin (FLOMAX) 0.4 MG CAPS capsule  . zolpidem (AMBIEN) 10 MG tablet    If labs acceptable day of surgery, I anticipate pt can proceed  with surgery as scheduled.   Rica Mastngela Erbie Arment, FNP-BC [facility] Phone: [phone] 02/08/2019 2:09 PM

## 2019-02-09 ENCOUNTER — Ambulatory Visit (HOSPITAL_BASED_OUTPATIENT_CLINIC_OR_DEPARTMENT_OTHER): Payer: BC Managed Care – PPO | Admitting: Emergency Medicine

## 2019-02-09 ENCOUNTER — Encounter (HOSPITAL_BASED_OUTPATIENT_CLINIC_OR_DEPARTMENT_OTHER): Payer: Self-pay | Admitting: Anesthesiology

## 2019-02-09 ENCOUNTER — Other Ambulatory Visit: Payer: Self-pay

## 2019-02-09 ENCOUNTER — Ambulatory Visit (HOSPITAL_BASED_OUTPATIENT_CLINIC_OR_DEPARTMENT_OTHER)
Admission: RE | Admit: 2019-02-09 | Discharge: 2019-02-09 | Disposition: A | Discharge status: Home / Self Care | Payer: BC Managed Care – PPO | Attending: Orthopedic Surgery | Admitting: Orthopedic Surgery

## 2019-02-09 ENCOUNTER — Encounter (HOSPITAL_BASED_OUTPATIENT_CLINIC_OR_DEPARTMENT_OTHER)
Admission: RE | Disposition: A | Discharge status: Home / Self Care | Payer: Self-pay | Source: Home / Self Care | Attending: Orthopedic Surgery

## 2019-02-09 DIAGNOSIS — G4733 Obstructive sleep apnea (adult) (pediatric): Secondary | ICD-10-CM | POA: Diagnosis not present

## 2019-02-09 DIAGNOSIS — I10 Essential (primary) hypertension: Secondary | ICD-10-CM | POA: Insufficient documentation

## 2019-02-09 DIAGNOSIS — Z96641 Presence of right artificial hip joint: Secondary | ICD-10-CM | POA: Diagnosis not present

## 2019-02-09 DIAGNOSIS — M7522 Bicipital tendinitis, left shoulder: Secondary | ICD-10-CM | POA: Diagnosis not present

## 2019-02-09 DIAGNOSIS — G4730 Sleep apnea, unspecified: Secondary | ICD-10-CM | POA: Diagnosis not present

## 2019-02-09 DIAGNOSIS — M19012 Primary osteoarthritis, left shoulder: Secondary | ICD-10-CM | POA: Diagnosis not present

## 2019-02-09 DIAGNOSIS — G2581 Restless legs syndrome: Secondary | ICD-10-CM | POA: Diagnosis not present

## 2019-02-09 DIAGNOSIS — Z79899 Other long term (current) drug therapy: Secondary | ICD-10-CM | POA: Diagnosis not present

## 2019-02-09 DIAGNOSIS — Z6841 Body Mass Index (BMI) 40.0 and over, adult: Secondary | ICD-10-CM | POA: Insufficient documentation

## 2019-02-09 DIAGNOSIS — F419 Anxiety disorder, unspecified: Secondary | ICD-10-CM | POA: Diagnosis not present

## 2019-02-09 DIAGNOSIS — F329 Major depressive disorder, single episode, unspecified: Secondary | ICD-10-CM | POA: Insufficient documentation

## 2019-02-09 DIAGNOSIS — M75102 Unspecified rotator cuff tear or rupture of left shoulder, not specified as traumatic: Secondary | ICD-10-CM

## 2019-02-09 HISTORY — DX: Unspecified rotator cuff tear or rupture of left shoulder, not specified as traumatic: M75.102

## 2019-02-09 HISTORY — DX: Bicipital tendinitis, left shoulder: M75.22

## 2019-02-09 HISTORY — PX: SHOULDER ARTHROSCOPY WITH ROTATOR CUFF REPAIR AND SUBACROMIAL DECOMPRESSION: SHX5686

## 2019-02-09 HISTORY — DX: Obstructive sleep apnea (adult) (pediatric): G47.33

## 2019-02-09 HISTORY — DX: Palpitations: R00.2

## 2019-02-09 HISTORY — DX: Benign prostatic hyperplasia with lower urinary tract symptoms: N40.1

## 2019-02-09 HISTORY — DX: Precordial pain: R07.2

## 2019-02-09 HISTORY — DX: Prediabetes: R73.03

## 2019-02-09 HISTORY — DX: Unspecified osteoarthritis, unspecified site: M19.90

## 2019-02-09 LAB — POCT I-STAT 4, (NA,K, GLUC, HGB,HCT)
Glucose, Bld: 139 mg/dL — ABNORMAL HIGH (ref 70–99)
HCT: 43 % (ref 39.0–52.0)
Hemoglobin: 14.6 g/dL (ref 13.0–17.0)
Potassium: 3.9 mmol/L (ref 3.5–5.1)
Sodium: 139 mmol/L (ref 135–145)

## 2019-02-09 LAB — GLUCOSE, CAPILLARY: Glucose-Capillary: 88 mg/dL (ref 70–99)

## 2019-02-09 SURGERY — SHOULDER ARTHROSCOPY WITH ROTATOR CUFF REPAIR AND SUBACROMIAL DECOMPRESSION
Anesthesia: General | Site: Shoulder | Laterality: Left

## 2019-02-09 MED ORDER — EPHEDRINE SULFATE 50 MG/ML IJ SOLN
INTRAMUSCULAR | Status: DC | PRN
  Administered 2019-02-09: 20 mg via INTRAVENOUS

## 2019-02-09 MED ORDER — HYDROMORPHONE HCL 1 MG/ML IJ SOLN
0.2500 mg | INTRAMUSCULAR | Status: DC | PRN
  Filled 2019-02-09: qty 0.5

## 2019-02-09 MED ORDER — FENTANYL CITRATE (PF) 100 MCG/2ML IJ SOLN
INTRAMUSCULAR | Status: DC | PRN
  Administered 2019-02-09: 100 ug via INTRAVENOUS

## 2019-02-09 MED ORDER — PHENYLEPHRINE 40 MCG/ML (10ML) SYRINGE FOR IV PUSH (FOR BLOOD PRESSURE SUPPORT)
PREFILLED_SYRINGE | INTRAVENOUS | Status: AC
  Filled 2019-02-09: qty 10

## 2019-02-09 MED ORDER — SUCCINYLCHOLINE CHLORIDE 20 MG/ML IJ SOLN
INTRAMUSCULAR | Status: DC | PRN
  Administered 2019-02-09: 120 mg via INTRAVENOUS

## 2019-02-09 MED ORDER — BUPIVACAINE HCL (PF) 0.5 % IJ SOLN
INTRAMUSCULAR | Status: DC | PRN
  Administered 2019-02-09: 10 mL via PERINEURAL

## 2019-02-09 MED ORDER — SODIUM CHLORIDE 0.9 % IV SOLN
INTRAVENOUS | Status: DC | PRN
  Administered 2019-02-09: 50 ug/min via INTRAVENOUS

## 2019-02-09 MED ORDER — EPHEDRINE 5 MG/ML INJ
INTRAVENOUS | Status: AC
  Filled 2019-02-09: qty 10

## 2019-02-09 MED ORDER — PHENYLEPHRINE HCL (PRESSORS) 10 MG/ML IV SOLN
INTRAVENOUS | Status: AC
  Filled 2019-02-09: qty 1

## 2019-02-09 MED ORDER — LACTATED RINGERS IV SOLN
INTRAVENOUS | Status: DC
  Administered 2019-02-09 (×2): via INTRAVENOUS
  Filled 2019-02-09: qty 1000

## 2019-02-09 MED ORDER — BUPIVACAINE-EPINEPHRINE 0.25% -1:200000 IJ SOLN
INTRAMUSCULAR | Status: DC | PRN
  Administered 2019-02-09: 5 mL

## 2019-02-09 MED ORDER — CEFAZOLIN SODIUM-DEXTROSE 2-4 GM/100ML-% IV SOLN
INTRAVENOUS | Status: AC
  Filled 2019-02-09: qty 100

## 2019-02-09 MED ORDER — GLYCOPYRROLATE 0.2 MG/ML IJ SOLN
INTRAMUSCULAR | Status: DC | PRN
  Administered 2019-02-09: 0.2 mg via INTRAVENOUS

## 2019-02-09 MED ORDER — LIDOCAINE 2% (20 MG/ML) 5 ML SYRINGE
INTRAMUSCULAR | Status: AC
  Filled 2019-02-09: qty 5

## 2019-02-09 MED ORDER — ONDANSETRON HCL 4 MG/2ML IJ SOLN
INTRAMUSCULAR | Status: AC
  Filled 2019-02-09: qty 2

## 2019-02-09 MED ORDER — SUGAMMADEX SODIUM 200 MG/2ML IV SOLN
INTRAVENOUS | Status: DC | PRN
  Administered 2019-02-09: 250 mg via INTRAVENOUS

## 2019-02-09 MED ORDER — FENTANYL CITRATE (PF) 100 MCG/2ML IJ SOLN
INTRAMUSCULAR | Status: AC
  Filled 2019-02-09: qty 2

## 2019-02-09 MED ORDER — GLYCOPYRROLATE PF 0.2 MG/ML IJ SOSY
PREFILLED_SYRINGE | INTRAMUSCULAR | Status: AC
  Filled 2019-02-09: qty 1

## 2019-02-09 MED ORDER — DOCUSATE SODIUM 100 MG PO CAPS: 100.0000 mg | ORAL_CAPSULE | Freq: Two times a day (BID) | ORAL | 0 refills | Status: DC

## 2019-02-09 MED ORDER — CHLORHEXIDINE GLUCONATE 4 % EX LIQD
60.0000 mL | Freq: Once | CUTANEOUS | Status: DC
  Filled 2019-02-09: qty 118

## 2019-02-09 MED ORDER — FENTANYL CITRATE (PF) 100 MCG/2ML IJ SOLN
100.0000 ug | Freq: Once | INTRAMUSCULAR | Status: AC
  Administered 2019-02-09: 100 ug via INTRAVENOUS
  Filled 2019-02-09: qty 2

## 2019-02-09 MED ORDER — MEPERIDINE HCL 25 MG/ML IJ SOLN
6.2500 mg | INTRAMUSCULAR | Status: DC | PRN
  Filled 2019-02-09: qty 1

## 2019-02-09 MED ORDER — METHOCARBAMOL 500 MG PO TABS: 500.0000 mg | ORAL_TABLET | Freq: Three times a day (TID) | ORAL | 0 refills | Status: DC | PRN

## 2019-02-09 MED ORDER — PROMETHAZINE HCL 25 MG/ML IJ SOLN
6.2500 mg | INTRAMUSCULAR | Status: DC | PRN
  Filled 2019-02-09: qty 1

## 2019-02-09 MED ORDER — PROPOFOL 10 MG/ML IV BOLUS
INTRAVENOUS | Status: AC
  Filled 2019-02-09: qty 40

## 2019-02-09 MED ORDER — CEFAZOLIN SODIUM-DEXTROSE 2-4 GM/100ML-% IV SOLN
2.0000 g | INTRAVENOUS | Status: AC
  Administered 2019-02-09: 2 g via INTRAVENOUS
  Filled 2019-02-09: qty 100

## 2019-02-09 MED ORDER — ROCURONIUM BROMIDE 100 MG/10ML IV SOLN
INTRAVENOUS | Status: DC | PRN
  Administered 2019-02-09: 50 mg via INTRAVENOUS

## 2019-02-09 MED ORDER — BUPIVACAINE LIPOSOME 1.3 % IJ SUSP
INTRAMUSCULAR | Status: DC | PRN
  Administered 2019-02-09: 10 mL

## 2019-02-09 MED ORDER — MIDAZOLAM HCL 2 MG/2ML IJ SOLN
2.0000 mg | Freq: Once | INTRAMUSCULAR | Status: AC
  Administered 2019-02-09: 2 mg via INTRAVENOUS
  Filled 2019-02-09: qty 2

## 2019-02-09 MED ORDER — LIDOCAINE HCL (CARDIAC) PF 100 MG/5ML IV SOSY
PREFILLED_SYRINGE | INTRAVENOUS | Status: DC | PRN
  Administered 2019-02-09: 100 mg via INTRAVENOUS

## 2019-02-09 MED ORDER — ONDANSETRON HCL 4 MG PO TABS: 4.0000 mg | ORAL_TABLET | Freq: Three times a day (TID) | ORAL | 0 refills | Status: DC | PRN

## 2019-02-09 MED ORDER — OXYCODONE HCL 5 MG PO TABS: 5.0000 mg | ORAL_TABLET | ORAL | 0 refills | Status: AC | PRN

## 2019-02-09 MED ORDER — ACETAMINOPHEN 500 MG PO TABS
1000.0000 mg | ORAL_TABLET | Freq: Once | ORAL | Status: AC
  Administered 2019-02-09: 1000 mg via ORAL
  Filled 2019-02-09: qty 2

## 2019-02-09 MED ORDER — LACTATED RINGERS IV SOLN
INTRAVENOUS | Status: DC
  Administered 2019-02-09: 10:00:00 via INTRAVENOUS
  Filled 2019-02-09: qty 1000

## 2019-02-09 MED ORDER — GABAPENTIN 300 MG PO CAPS
300.0000 mg | ORAL_CAPSULE | Freq: Once | ORAL | Status: AC
  Administered 2019-02-09: 300 mg via ORAL
  Filled 2019-02-09: qty 1

## 2019-02-09 MED ORDER — PROPOFOL 10 MG/ML IV BOLUS
INTRAVENOUS | Status: DC | PRN
  Administered 2019-02-09: 200 mg via INTRAVENOUS
  Administered 2019-02-09: 50 mg via INTRAVENOUS

## 2019-02-09 MED ORDER — ONDANSETRON HCL 4 MG/2ML IJ SOLN
INTRAMUSCULAR | Status: DC | PRN
  Administered 2019-02-09: 4 mg via INTRAVENOUS

## 2019-02-09 MED ORDER — SODIUM CHLORIDE 0.9 % IR SOLN
Status: DC | PRN
  Administered 2019-02-09 (×6): 3000 mL

## 2019-02-09 MED ORDER — GABAPENTIN 300 MG PO CAPS
ORAL_CAPSULE | ORAL | Status: AC
  Filled 2019-02-09: qty 1

## 2019-02-09 MED ORDER — ACETAMINOPHEN 500 MG PO TABS: 1000.0000 mg | ORAL_TABLET | Freq: Three times a day (TID) | ORAL | 0 refills | Status: AC

## 2019-02-09 MED ORDER — GABAPENTIN 300 MG PO CAPS: 300.0000 mg | ORAL_CAPSULE | Freq: Three times a day (TID) | ORAL | 0 refills | Status: DC | PRN

## 2019-02-09 MED ORDER — MIDAZOLAM HCL 2 MG/2ML IJ SOLN
INTRAMUSCULAR | Status: AC
  Filled 2019-02-09: qty 2

## 2019-02-09 MED ORDER — SUGAMMADEX SODIUM 500 MG/5ML IV SOLN
INTRAVENOUS | Status: AC
  Filled 2019-02-09: qty 5

## 2019-02-09 MED ORDER — DEXAMETHASONE SODIUM PHOSPHATE 4 MG/ML IJ SOLN
INTRAMUSCULAR | Status: DC | PRN
  Administered 2019-02-09: 10 mg via INTRAVENOUS

## 2019-02-09 MED ORDER — METHYLPREDNISOLONE ACETATE 80 MG/ML IJ SUSP
INTRAMUSCULAR | Status: DC | PRN
  Administered 2019-02-09: 80 mg

## 2019-02-09 MED ORDER — DEXAMETHASONE SODIUM PHOSPHATE 10 MG/ML IJ SOLN
INTRAMUSCULAR | Status: AC
  Filled 2019-02-09: qty 1

## 2019-02-09 MED ORDER — ACETAMINOPHEN 500 MG PO TABS
ORAL_TABLET | ORAL | Status: AC
  Filled 2019-02-09: qty 2

## 2019-02-09 MED ORDER — ROCURONIUM BROMIDE 10 MG/ML (PF) SYRINGE
PREFILLED_SYRINGE | INTRAVENOUS | Status: AC
  Filled 2019-02-09: qty 10

## 2019-02-09 MED ORDER — PHENYLEPHRINE HCL (PRESSORS) 10 MG/ML IV SOLN
INTRAVENOUS | Status: DC | PRN
  Administered 2019-02-09 (×6): 40 ug via INTRAVENOUS

## 2019-02-09 SURGICAL SUPPLY — 81 items
AID PSTN UNV HD RSTRNT DISP (MISCELLANEOUS) ×1
ANCH SUT SHRT 12.5 CANN EYLT (Anchor) ×1 IMPLANT
ANCH SUT SWLK 19.1X4.75 (Anchor) ×1 IMPLANT
ANCHOR SUT BIO SW 4.75X19.1 (Anchor) ×1 IMPLANT
ANCHOR SUT BIOCOMP LK 2.9X12.5 (Anchor) ×1 IMPLANT
BLADE CUTTER GATOR 3.5 (BLADE) IMPLANT
BLADE CUTTER MENIS 5.5 (BLADE) IMPLANT
BLADE GREAT WHITE 4.2 (BLADE) IMPLANT
BLADE SURG 15 STRL LF DISP TIS (BLADE) IMPLANT
BLADE SURG 15 STRL SS (BLADE)
BUR OVAL 4.0 (BURR) IMPLANT
BUR OVAL 6.0 (BURR) IMPLANT
BURR CLEARCUT OVAL 5.5X13 (MISCELLANEOUS) ×1 IMPLANT
BURR OVAL 12 FL 5.5X13 (MISCELLANEOUS) ×1
CANNULA 5.75X71 LONG (CANNULA) IMPLANT
CANNULA TWIST IN 8.25X7CM (CANNULA) ×1 IMPLANT
CHLORAPREP W/TINT 26ML (MISCELLANEOUS) ×2 IMPLANT
CLSR STERI-STRIP ANTIMIC 1/2X4 (GAUZE/BANDAGES/DRESSINGS) IMPLANT
COVER WAND RF STERILE (DRAPES) ×2 IMPLANT
DISSECTOR 4.0MM X 13CM (MISCELLANEOUS) ×1 IMPLANT
DRAPE IMP U-DRAPE 54X76 (DRAPES) ×2 IMPLANT
DRAPE INCISE IOBAN 66X45 STRL (DRAPES) ×2 IMPLANT
DRAPE SHOULDER BEACH CHAIR (DRAPES) ×2 IMPLANT
DRAPE U-SHAPE 47X51 STRL (DRAPES) ×2 IMPLANT
DRSG ADAPTIC 3X8 NADH LF (GAUZE/BANDAGES/DRESSINGS) ×1 IMPLANT
DRSG EMULSION OIL 3X3 NADH (GAUZE/BANDAGES/DRESSINGS) IMPLANT
DRSG PAD ABDOMINAL 8X10 ST (GAUZE/BANDAGES/DRESSINGS) ×2 IMPLANT
ELECT REM PT RETURN 9FT ADLT (ELECTROSURGICAL)
ELECTRODE REM PT RTRN 9FT ADLT (ELECTROSURGICAL) IMPLANT
GAUZE SPONGE 4X4 12PLY STRL (GAUZE/BANDAGES/DRESSINGS) ×3 IMPLANT
GLOVE BIO SURGEON STRL SZ7.5 (GLOVE) ×5 IMPLANT
GLOVE BIOGEL PI IND STRL 8 (GLOVE) ×2 IMPLANT
GLOVE BIOGEL PI INDICATOR 8 (GLOVE) ×2
GOWN STRL REUS W/ TWL LRG LVL3 (GOWN DISPOSABLE) ×3 IMPLANT
GOWN STRL REUS W/ TWL XL LVL3 (GOWN DISPOSABLE) IMPLANT
GOWN STRL REUS W/TWL LRG LVL3 (GOWN DISPOSABLE) ×6
GOWN STRL REUS W/TWL XL LVL3 (GOWN DISPOSABLE)
KIT INSERTION 2.9 PUSHLOCK (KITS) ×2 IMPLANT
KIT PUSHLOCK 2.9 HIP (KITS) ×1 IMPLANT
MANIFOLD NEPTUNE II (INSTRUMENTS) ×2 IMPLANT
NDL SCORPION MULTI FIRE (NEEDLE) IMPLANT
NDL SUT 6 .5 CRC .975X.05 MAYO (NEEDLE) IMPLANT
NEEDLE MAYO TAPER (NEEDLE)
NEEDLE SCORPION MULTI FIRE (NEEDLE) ×2 IMPLANT
NS IRRIG 1000ML POUR BTL (IV SOLUTION) IMPLANT
PACK ARTHROSCOPY DSU (CUSTOM PROCEDURE TRAY) ×2 IMPLANT
PACK BASIN DAY SURGERY FS (CUSTOM PROCEDURE TRAY) ×2 IMPLANT
PASSER SUT SWIFTSTITCH HIP CRT (INSTRUMENTS) ×2 IMPLANT
PENCIL BUTTON HOLSTER BLD 10FT (ELECTRODE) IMPLANT
PORT APPOLLO RF 90DEGREE MULTI (SURGICAL WAND) ×1 IMPLANT
PROBE BIPOLAR ATHRO 135MM 90D (MISCELLANEOUS) ×2 IMPLANT
RESTRAINT HEAD UNIVERSAL NS (MISCELLANEOUS) ×2 IMPLANT
SLEEVE SCD COMPRESS KNEE MED (MISCELLANEOUS) ×2 IMPLANT
SLING ARM FOAM STRAP LRG (SOFTGOODS) IMPLANT
SLING ARM FOAM STRAP XLG (SOFTGOODS) IMPLANT
SLING ARM IMMOBILIZER LRG (SOFTGOODS) IMPLANT
SLING ARM IMMOBILIZER MED (SOFTGOODS) IMPLANT
SLING ARM MED ADULT FOAM STRAP (SOFTGOODS) IMPLANT
SLING ARM XL FOAM STRAP (SOFTGOODS) IMPLANT
SLING ULTRA II L (ORTHOPEDIC SUPPLIES) ×1 IMPLANT
SUCTION FRAZIER HANDLE 10FR (MISCELLANEOUS)
SUCTION TUBE FRAZIER 10FR DISP (MISCELLANEOUS) IMPLANT
SUT ETHIBOND 2 OS 4 DA (SUTURE) IMPLANT
SUT ETHILON 2 0 FS 18 (SUTURE) IMPLANT
SUT ETHILON 3 0 PS 1 (SUTURE) ×2 IMPLANT
SUT FIBERWIRE #2 38 T-5 BLUE (SUTURE)
SUT MNCRL AB 4-0 PS2 18 (SUTURE) IMPLANT
SUT TIGER TAPE 7 IN WHITE (SUTURE) IMPLANT
SUT VIC AB 0 CT1 27 (SUTURE)
SUT VIC AB 0 CT1 27XBRD ANBCTR (SUTURE) IMPLANT
SUT VIC AB 2-0 SH 27 (SUTURE)
SUT VIC AB 2-0 SH 27XBRD (SUTURE) IMPLANT
SUT VIC AB 3-0 FS2 27 (SUTURE) IMPLANT
SUTURE FIBERWR #2 38 T-5 BLUE (SUTURE) IMPLANT
SUTURE TAPE TIGERLINK 1.3MM BL (SUTURE) IMPLANT
SUTURETAPE TIGERLINK 1.3MM BL (SUTURE) ×2
TAPE FIBER 2MM 7IN #2 BLUE (SUTURE) ×1 IMPLANT
TOWEL OR 17X26 10 PK STRL BLUE (TOWEL DISPOSABLE) ×2 IMPLANT
TUBING ARTHRO INFLOW-ONLY STRL (TUBING) ×2 IMPLANT
WATER STERILE IRR 1000ML POUR (IV SOLUTION) ×2 IMPLANT
YANKAUER SUCT BULB TIP NO VENT (SUCTIONS) IMPLANT

## 2019-02-09 NOTE — Discharge Instructions (Signed)
Maintain sling until follow up.  Diet: As you were doing prior to hospitalization   Dressing:  Keep dressings on and dry.  You may remove dressings in 3 days and shower over incisions.  No Bath / submerging incisions.  Cover with clean Band-Aid.  Activity:  Increase activity slowly as tolerated, but follow the weight bearing instructions below.  The rules on driving is that you can not be taking narcotics while you drive, and you must feel in control of the vehicle.    Weight Bearing:  Do not bear weight with affected arm.   Pain:  For severe pain, you may increase breakthrough pain medication (oxycodone) for the first few days post op to 2 tablets every 4 hours.  Stop this medication as soon as you are able.  Constipation: Narcotic pain medications cause constipation.  Reduce use or stop taking if you become constipated.  Drink plenty of fluids (prune juice may be helpful) and high fiber foods.  You may use a stool softener such as -  Colace (over the counter) 100 mg by mouth twice a day  And/or Miralax (over the counter) for constipation as needed.    Itching:  If you experience itching with your medications, try taking only a single pain pill, or even half a pain pill at a time.  You can also use benadryl over the counter for itching or also to help with sleep.   Precautions:  If you experience chest pain or shortness of breath - call 911 immediately for transfer to the hospital emergency department!!  If you develop a fever greater that 101 F, purulent drainage from wound, increased redness or drainage from wound, or calf pain -- Call the office at [phone]                                                 Follow- Up Appointment:  Please call for an appointment to be seen in 2 weeks [place] - [phone]   Call your surgeon if you experience:   1.  Fever over 101.0. 2.  Inability to urinate. 3.  Nausea and/or vomiting. 4.  Extreme swelling or bruising at the surgical  site. 5.  Continued bleeding from the incision. 6.  Increased pain, redness or drainage from the incision. 7.  Problems related to your pain medication. 8.  Any problems and/or concerns    Information for Discharge Teaching: EXPAREL (bupivacaine liposome injectable suspension)   Your surgeon gave you EXPAREL(bupivacaine) in your surgical incision to help control your pain after surgery.   EXPAREL is a local anesthetic that provides pain relief by numbing the tissue around the surgical site.  EXPAREL is designed to release pain medication over time and can control pain for up to 72 hours.  Depending on how you respond to EXPAREL, you may require less pain medication during your recovery.  Possible side effects:  Temporary loss of sensation or ability to move in the area where bupivacaine was injected.  Nausea, vomiting, constipation  Rarely, numbness and tingling in your mouth or lips, lightheadedness, or anxiety may occur.  Call your doctor right away if you think you may be experiencing any of these sensations, or if you have other questions regarding possible side effects.  Follow all other discharge instructions given to you by your surgeon or nurse. Eat a healthy diet  and drink plenty of water or other fluids.  If you return to the hospital for any reason within 96 hours following the administration of EXPAREL, please inform your health care providers.  Surgery for Rotator Cuff Tear, Care After This sheet gives you information about how to care for yourself after your procedure. Your health care provider may also give you more specific instructions. If you have problems or questions, contact your health care provider. What can I expect after the procedure? After the procedure, it is common to have:  Swelling.  Pain.  Stiffness.  Tenderness. Follow these instructions at home: If you have a sling or a shoulder immobilizer:  Wear it as told by your health care  provider. Remove it only as told by your health care provider.  Loosen it if your fingers tingle, become numb, or turn cold and blue.  Keep it clean. Bathing  Do not take baths, swim, or use a hot tub until your health care provider approves. Ask your health care provider if you may take showers. You may only be allowed to take sponge baths.  Keep your bandage (dressing) dry until your health care provider says it can be removed.  If your sling or shoulder immobilizer is not waterproof: ? Do not let it get wet. ? Remove it when you take a bath or shower as told by your health care provider. Once the sling or shoulder immobilizer is removed, try not to move your shoulder until your health care provider says that you can. Incision care   Follow instructions from your health care provider about how to take care of your incision. Make sure you: ? Wash your hands with soap and water before and after you change your dressing. If soap and water are not available, use hand sanitizer. ? Change your dressing as told by your health care provider. ? Leave stitches (sutures), skin glue, or adhesive strips in place. These skin closures may need to stay in place for 2 weeks or longer. If adhesive strip edges start to loosen and curl up, you may trim the loose edges. Do not remove adhesive strips completely unless your health care provider tells you to do that.  Check your incision area every day for signs of infection. Check for: ? More redness, swelling, or pain. ? More fluid or blood. ? Warmth. ? Pus or a bad smell. Managing pain, stiffness, and swelling   If directed, put ice on your shoulder area. ? Put ice in a plastic bag. ? Place a towel between your skin and the bag. ? Leave the ice on for 20 minutes, 2-3 times a day.  Move your fingers often to reduce stiffness and swelling.  Raise (elevate) your upper body on pillows when you lie down and when you sleep. ? Do not sleep on the front  of your body (abdomen). ? Do not sleep on the side that your surgery was performed on. Medicines  Take over-the-counter and prescription medicines only as told by your health care provider.  Ask your health care provider if the medicine prescribed to you: ? Requires you to avoid driving or using heavy machinery. ? Can cause constipation. You may need to take actions to prevent or treat constipation, such as:  Drink enough fluid to keep your urine pale yellow.  Take over-the-counter or prescription medicines.  Eat foods that are high in fiber, such as beans, whole grains, and fresh fruits and vegetables.  Limit foods that are high in fat  and processed sugars, such as fried or sweet foods. Driving  Do not drive for 24 hours if you were given a sedative during your procedure.  Do not drive while wearing a sling or a shoulder immobilizer. Ask your health care provider when it is safe to drive. Activity  Do not use your arm to support your body weight until your health care provider says that you can.  Do not lift or hold anything with your arm until your health care provider approves.  Return to your normal activities as told by your health care provider. Ask your health care provider what activities are safe for you.  Do exercises as told by your health care provider. General instructions  Do not use any products that contain nicotine or tobacco, such as cigarettes, e-cigarettes, and chewing tobacco. These can delay healing after surgery. If you need help quitting, ask your health care provider.  Keep all follow-up visits as told by your health care provider. This is important. Contact a health care provider if:  You have a fever.  You have more redness, swelling, or pain around your incision.  You have more fluid or blood coming from your incision.  Your incision feels warm to the touch.  You have pus or a bad smell coming from your incision.  You have pain that gets  worse or does not get better with medicine. Get help right away if:  You have severe pain.  You lose feeling in your arm or hand.  Your hand or fingers turn very pale or blue. Summary  If you have a sling, wear it as told by your health care provider. Remove it only as told by your health care provider.  Change your dressing as told by your health care provider. Check the incision area every day for signs of infection.  If directed, put ice on your shoulder area 2-3 times a day.  Do not use your arm to lift anything or to support your body weight until your health care provider says that you can. This information is not intended to replace advice given to you by your health care provider. Make sure you discuss any questions you have with your health care provider. Document Released: 07/15/2005 Document Revised: 04/20/2018 Document Reviewed: 04/22/2018 Elsevier Patient Education  2020 [employer].

## 2019-02-09 NOTE — Transfer of Care (Signed)
Immediate Anesthesia Transfer of Care Note  Patient: BLAND RUDZINSKI  Procedure(s) Performed: Procedure(s) (LRB): LEFT SHOULDER ARTHROSCOPY WITH ROTATOR CUFF REPAIR AND SUBACROMIAL PARTIAL ACROMIOPLASTY, DISTAL CLAVICULECTOMY, EXTENSIVE DEBRIDEMENT BICEPS TENODENSIS (Left)  Patient Location: PACU  Anesthesia Type: General  Level of Consciousness: awake, sedated, patient cooperative and responds to stimulation  Airway & Oxygen Therapy: Patient Spontanous Breathing and Patient connected to [place] O2 and soft face mask   Post-op Assessment: Report given to PACU RN, Post -op Vital signs reviewed and stable and Patient moving all extremities  Post vital signs: Reviewed and stable  Complications: No apparent anesthesia complications

## 2019-02-09 NOTE — Anesthesia Postprocedure Evaluation (Signed)
Anesthesia Post Note  Patient: [name] Pacheco  Procedure(s) Performed: LEFT SHOULDER ARTHROSCOPY WITH ROTATOR CUFF REPAIR AND SUBACROMIAL PARTIAL ACROMIOPLASTY, DISTAL CLAVICULECTOMY, EXTENSIVE DEBRIDEMENT BICEPS TENODENSIS (Left Shoulder)     Patient location during evaluation: PACU Anesthesia Type: General Level of consciousness: sedated and patient cooperative Pain management: pain level controlled Vital Signs Assessment: post-procedure vital signs reviewed and stable Respiratory status: spontaneous breathing Cardiovascular status: stable Anesthetic complications: no    Last Vitals:  Vitals:   02/09/19 1257 02/09/19 1300  BP: 123/77   Pulse: 84 87  Resp: 20 20  Temp:    SpO2: 96% 94%    Last Pain:  Vitals:   02/09/19 1400  TempSrc:   PainSc: 0-No pain                 Nolon Nations

## 2019-02-09 NOTE — Interval H&P Note (Signed)
I participated in the care of this patient and agree with the above history, physical and evaluation. I performed a review of the history and a physical exam as detailed   Natoshia Souter Daniel Monish Haliburton MD  

## 2019-02-09 NOTE — Progress Notes (Signed)
Assisted Dr. Germeroth with left, ultrasound guided, interscalene  block. Side rails up, monitors on throughout procedure. See vital signs in flow sheet. Tolerated Procedure well. 

## 2019-02-09 NOTE — Anesthesia Procedure Notes (Signed)
Anesthesia Regional Block: Interscalene brachial plexus block   Pre-Anesthetic Checklist: ,, timeout performed, Correct Patient, Correct Site, Correct Laterality, Correct Procedure, Correct Position, site marked, Risks and benefits discussed,  Surgical consent,  Pre-op evaluation,  At surgeon's request and post-op pain management  Laterality: Left  Prep: chloraprep       Needles:  Injection technique: Single-shot  Needle Type: Stimulator Needle - 40     Needle Length: 4cm  Needle Gauge: 22     Additional Needles:   Procedures:,,,, ultrasound used (permanent image in chart),,,,  Narrative:  Start time: 02/09/2019 9:17 AM End time: 02/09/2019 9:24 AM Injection made incrementally with aspirations every 5 mL. Anesthesiologist: Nolon Nations, MD  Additional Notes: BP cuff, EKG monitors applied. Sedation begun. Nerve location verified with U/S. Anesthetic injected incrementally, slowly , and after neg aspirations under direct u/s guidance. Good perineural spread. Tolerated well.

## 2019-02-09 NOTE — Anesthesia Procedure Notes (Signed)
Procedure Name: Intubation Date/Time: 02/09/2019 10:20 AM Performed by: Justice Rocher, CRNA Pre-anesthesia Checklist: Patient identified, Emergency Drugs available, Suction available and Patient being monitored Patient Re-evaluated:Patient Re-evaluated prior to induction Oxygen Delivery Method: Circle system utilized Preoxygenation: Pre-oxygenation with 100% oxygen Induction Type: IV induction Ventilation: Mask ventilation without difficulty Laryngoscope Size: Mac and 4 Grade View: Grade III Tube type: Oral Tube size: 8.0 mm Number of attempts: 1 Airway Equipment and Method: Stylet and Oral airway Placement Confirmation: ETT inserted through vocal cords under direct vision,  positive ETCO2 and breath sounds checked- equal and bilateral Secured at: 24 cm Tube secured with: Tape Dental Injury: Teeth and Oropharynx as per pre-operative assessment

## 2019-02-09 NOTE — Anesthesia Preprocedure Evaluation (Signed)
Anesthesia Evaluation  Patient identified by MRN, date of birth, ID band Patient awake    Reviewed: Allergy & Precautions, H&P , NPO status , Patient's Chart, lab work & pertinent test results  Airway Mallampati: II   Neck ROM: full    Dental  (+) Dental Advisory Given   Pulmonary sleep apnea and Continuous Positive Airway Pressure Ventilation ,    Pulmonary exam normal breath sounds clear to auscultation       Cardiovascular hypertension, Normal cardiovascular exam Rhythm:Regular Rate:Normal     Neuro/Psych Anxiety Depression    GI/Hepatic   Endo/Other  Morbid obesity  Renal/GU      Musculoskeletal  (+) Arthritis ,   Abdominal (+) + obese,   Peds  Hematology   Anesthesia Other Findings   Reproductive/Obstetrics                             Anesthesia Physical Anesthesia Plan  ASA: III  Anesthesia Plan: General   Post-op Pain Management: GA combined w/ Regional for post-op pain   Induction: Intravenous  PONV Risk Score and Plan: 2 and Ondansetron, Dexamethasone, Treatment may vary due to age or medical condition and Midazolam  Airway Management Planned: Oral ETT  Additional Equipment: None  Intra-op Plan:   Post-operative Plan: Extubation in OR  Informed Consent: I have reviewed the patients History and Physical, chart, labs and discussed the procedure including the risks, benefits and alternatives for the proposed anesthesia with the patient or authorized representative who has indicated his/her understanding and acceptance.     Dental advisory given  Plan Discussed with: CRNA  Anesthesia Plan Comments:         Anesthesia Quick Evaluation

## 2019-02-10 ENCOUNTER — Encounter (HOSPITAL_BASED_OUTPATIENT_CLINIC_OR_DEPARTMENT_OTHER): Payer: Self-pay | Admitting: Orthopedic Surgery

## 2019-02-11 NOTE — Op Note (Signed)
02/09/2019  8:08 AM  PATIENT:  [name] Pacheco    PRE-OPERATIVE DIAGNOSIS:  LEFT SHOULDER OSTEOARTHRITIS  POST-OPERATIVE DIAGNOSIS:  Same  PROCEDURE:  LEFT SHOULDER ARTHROSCOPY WITH ROTATOR CUFF REPAIR AND SUBACROMIAL PARTIAL ACROMIOPLASTY, DISTAL CLAVICULECTOMY, EXTENSIVE DEBRIDEMENT BICEPS TENODENSIS  SURGEON:  Sheral Apleyimothy D Hally Colella, MD  ASSISTANT: Aquilla HackerHenry Martensen, PA-C, he was present and scrubbed throughout the case, critical for completion in a timely fashion, and for retraction, instrumentation, and closure.   ANESTHESIA:   General  PREOPERATIVE INDICATIONS:  [name] Pacheco is a  48 y.o. male with a diagnosis of LEFT SHOULDER OSTEOARTHRITIS who failed conservative measures and elected for surgical management.    The risks benefits and alternatives were discussed with the patient preoperatively including but not limited to the risks of infection, bleeding, nerve injury, cardiopulmonary complications, the need for revision surgery, among others, and the patient was willing to proceed.  OPERATIVE IMPLANTS: arthrex anchors  OPERATIVE FINDINGS: ant SS cuff tear, SLAP and bi tendonitis  BLOOD LOSS: minimal  COMPLICATIONS: none  OPERATIVE PROCEDURE:  Patient was identified in the preoperative holding area and site was marked by me He was transported to the operating theater and placed on the table in beach chair position taking care to pad all bony prominences. After a preincinduction time out anesthesia was induced. The left upper extremity was prepped and draped in normal sterile fashion and a pre-incision timeout was performed. [name] Pacheco received ancef for preoperative antibiotics.   Initially made a posterior arthroscopic portal and inserted the arthroscope into the glenohumeral joint. tour of the joint demonstrated the above operative findings  I created an anterior portal just lateral to the coracoid under direct visualization using a spinal needle.  I performed an  extensive debridement of the scarred synovial tissue and remaining structures   I used a combination of biter and shaver to release the biceps tendon from the superior labrum and then used the shaver to debride the superior labrum to a smooth rim. I performed a loop and tack Bi tenodesis  I then introduced the arthroscope into the subacromial space and brought the shaver into the anterior portal. I debrided the bursa for appropriate visualization.  I then performed a subacromial decompression using combination of the shaver ArthroCare and burr using a cutting block technique. As happy with the final elevation of the subacromial space on multiple portal views.  Next I turned my attention to the distal clavicle and through the anterior portal using the bur and shaver I was able to perform a distal clavicle excision. I then switched portals and inserted the arthroscope into the anterior portal and was happy with an appropriate resection of the distal clavicle.   I debrided the rotator cuff tear and examined its mobility. There was a roughly 5 millimeters tear and I debrided the tear here. I then debrided the footprint to good bone for placement of the tendon. Next I marked the spot for the push locks. I used the scorpion to pass a horizontal mattress stitch through the tendon with even spread. I placed this down to bone with good bite on the SL anchor.  I was happy with the tendon apposition and there was minimal to no dog ear.  Next I removed all arthroscopic equipment expressed all fluid and closed the portals with a nylon stitch. A sterile dressing was applied the patient was taken the PACU in stable condition.  POST OPERATIVE PLAN: The patient will be in a sling full-time and keep  the dressings clean dry and intact. DVT prophylaxis will consist of early ambulation

## 2019-05-01 ENCOUNTER — Other Ambulatory Visit: Payer: Self-pay | Admitting: Psychiatry

## 2019-06-14 ENCOUNTER — Other Ambulatory Visit: Payer: Self-pay

## 2019-06-14 DIAGNOSIS — Z20822 Contact with and (suspected) exposure to covid-19: Secondary | ICD-10-CM

## 2019-06-16 LAB — NOVEL CORONAVIRUS, NAA: SARS-CoV-2, NAA: NOT DETECTED

## 2019-09-02 ENCOUNTER — Encounter: Payer: Self-pay | Admitting: Psychiatry

## 2019-09-02 ENCOUNTER — Ambulatory Visit (INDEPENDENT_AMBULATORY_CARE_PROVIDER_SITE_OTHER): Payer: BC Managed Care – PPO | Admitting: Psychiatry

## 2019-09-02 DIAGNOSIS — F5105 Insomnia due to other mental disorder: Secondary | ICD-10-CM | POA: Diagnosis not present

## 2019-09-02 DIAGNOSIS — F411 Generalized anxiety disorder: Secondary | ICD-10-CM | POA: Diagnosis not present

## 2019-09-02 DIAGNOSIS — G2581 Restless legs syndrome: Secondary | ICD-10-CM | POA: Diagnosis not present

## 2019-09-02 NOTE — Progress Notes (Signed)
[name] Pacheco [id number] February 12, 1971 48 y.o.   Virtual Visit via [facility] [place]  I connected with pt by WebEx and verified that I am speaking with the correct person using two identifiers.   I discussed the limitations, risks, security and privacy concerns of performing an evaluation and management service by [facility] and the availability of in person appointments. I also discussed with the patient that there may be a patient responsible charge related to this service. The patient expressed understanding and agreed to proceed.  I discussed the assessment and treatment plan with the patient. The patient was provided an opportunity to ask questions and all were answered. The patient agreed with the plan and demonstrated an understanding of the instructions.   The patient was advised to call back or seek an in-person evaluation if the symptoms worsen or if the condition fails to improve as anticipated.  I provided 30 minutes of video time during this encounter. The call started at 900 and ended at 9:30. The patient was located at home and the provider was located office.    Subjective:   Patient ID:  [name] Pacheco is a 49 y.o. (DOB [birth year]) male.  Chief Complaint:  Chief Complaint  Patient presents with  . Follow-up    Medication Mangement  . Anxiety    Medication Mangement    Anxiety Patient reports no confusion, decreased concentration, nervous/anxious behavior or suicidal ideas.    Medication Refill Pertinent negatives include no weakness.   [name] Pacheco presents to the office today for follow-up of GAD, RLS, sleep.  Last seen May 2020 .  No meds changed.  Clonazepam 0.5 mg occasionally when does stressful things or with exacerbation of RLS.  6 tablets in 32mos.  Overall doing well. A little more anxiety with Covid.  Working from home.  As is wife and daughter.  Uses clonazepam and Ambien rarely except when travels.  CPAP helps a good bit. Patient reports stable mood and  denies depressed or irritable moods.  Patient has minimal difficulty with anxiety except mild sx.  Patient denies difficulty with sleep initiation or maintenance. Denies appetite disturbance.  Patient reports that energy and motivation have been good.  Patient denies any difficulty with concentration.  Patient denies any suicidal ideation. Sleep 6-7 hours.RLS controlled.    Satisfied with meds.  Yawn a lot.  RLS been ok without ropinirole lately but wants it.  Walking more.  Self employed and limited by Covid.  Wife working.  No recent clonazepam, nor Ambien but occ.  Past Psychiatric Medication Trials: sertraline x many years, clonazepam, ropinirole ambien f Review of Systems:  Review of Systems  Constitutional: Negative for unexpected weight change.  Cardiovascular:       Increase BP  Neurological: Negative for tremors and weakness.  Psychiatric/Behavioral: Negative for agitation, behavioral problems, confusion, decreased concentration, dysphoric mood, hallucinations, self-injury, sleep disturbance and suicidal ideas. The patient is not nervous/anxious and is not hyperactive.     Medications: I have reviewed the patient's current medications.  Current Outpatient Medications  Medication Sig Dispense Refill  . cholecalciferol (VITAMIN D3) 25 MCG (1000 UNIT) tablet Take 1,000 Units by mouth daily.    . clonazePAM (KLONOPIN) 0.5 MG tablet Take 1 tablet (0.5 mg total) by mouth 2 (two) times daily as needed for anxiety. (Patient taking differently: Take 0.5 mg by mouth 2 (two) times daily as needed for anxiety. ) 30 tablet 5  . metoprolol succinate (TOPROL-XL) 50 MG 24 hr tablet Take 50 mg  by mouth daily.    Marland Kitchen rOPINIRole (REQUIP) 0.5 MG tablet TAKE 2 TABLETS (1 MG TOTAL) BY MOUTH AT BEDTIME. 180 tablet 1  . sertraline (ZOLOFT) 100 MG tablet Take 1.5 tablets (150 mg total) by mouth daily. (Patient taking differently: Take 150 mg by mouth at bedtime. ) 135 tablet 3  . simvastatin (ZOCOR) 40  MG tablet Take 40 mg by mouth at bedtime.     . tamsulosin (FLOMAX) 0.4 MG CAPS capsule Take 0.4 mg by mouth at bedtime.     Marland Kitchen zolpidem (AMBIEN) 10 MG tablet Take 1 tablet (10 mg total) by mouth at bedtime as needed for sleep. 30 tablet 3   No current facility-administered medications for this visit.    Medication Side Effects: Other: yawns.  Not that often.  Allergies:  Allergies  Allergen Reactions  . Celebrex [Celecoxib] Itching and Rash    Past Medical History:  Diagnosis Date  . Anxiety   . Benign localized prostatic hyperplasia with lower urinary tract symptoms (LUTS)   . Biceps tendonitis on left   . Depression   . History of kidney stones   . Hypertension   . OA (osteoarthritis)    left hip, hands  . OSA on CPAP   . Palpitations    02-05-2019  currently pt was seen by dr Leeann Must, cardiologist,  started wearing a 30 day monitor 01-18-2019 until 02-11-2019  . Pre-diabetes   . Precordial chest pain    02-05-2019 per pt had stress echo ordered by dr Leeann Must, cardiologist,  01-18-2019 , pt does not know results  . Restless leg syndrome   . Rotator cuff tear, left     History reviewed. No pertinent family history.  Social History   Socioeconomic History  . Marital status: Single    Spouse name: Not on file  . Number of children: Not on file  . Years of education: Not on file  . Highest education level: Not on file  Occupational History  . Not on file  Tobacco Use  . Smoking status: Never Smoker  . Smokeless tobacco: Never Used  Substance and Sexual Activity  . Alcohol use: Yes    Comment: rare  . Drug use: Never  . Sexual activity: Not on file  Other Topics Concern  . Not on file  Social History Narrative  . Not on file   Social Determinants of Health   Financial Resource Strain:   . Difficulty of Paying Living Expenses: Not on file  Food Insecurity:   . Worried About Programme researcher, broadcasting/film/video in the Last Year: Not on file  . Ran Out of Food in the Last  Year: Not on file  Transportation Needs:   . Lack of Transportation (Medical): Not on file  . Lack of Transportation (Non-Medical): Not on file  Physical Activity:   . Days of Exercise per Week: Not on file  . Minutes of Exercise per Session: Not on file  Stress:   . Feeling of Stress : Not on file  Social Connections:   . Frequency of Communication with Friends and Family: Not on file  . Frequency of Social Gatherings with Friends and Family: Not on file  . Attends Religious Services: Not on file  . Active Member of Clubs or Organizations: Not on file  . Attends Banker Meetings: Not on file  . Marital Status: Not on file  Intimate Partner Violence:   . Fear of Current or Ex-Partner: Not on file  .  Emotionally Abused: Not on file  . Physically Abused: Not on file  . Sexually Abused: Not on file    Past Medical History, Surgical history, Social history, and Family history were reviewed and updated as appropriate.   Please see review of systems for further details on the patient's review from today.   Objective:   Physical Exam:  There were no vitals taken for this visit.  Physical Exam Neurological:     Mental Status: He is alert and oriented to person, place, and time.     Cranial Nerves: No dysarthria.  Psychiatric:        Attention and Perception: Attention normal.        Mood and Affect: Mood is anxious.        Speech: Speech normal.        Behavior: Behavior is cooperative.        Thought Content: Thought content normal. Thought content is not paranoid or delusional. Thought content does not include homicidal or suicidal ideation. Thought content does not include homicidal or suicidal plan.        Cognition and Memory: Cognition and memory normal.        Judgment: Judgment normal.     Comments: Mild increase in anxiety with stress.     Lab Review:     Component Value Date/Time   NA 139 02/09/2019 0912   K 3.9 02/09/2019 0912   CL 103 06/22/2014  1031   CO2 24 06/22/2014 1031   GLUCOSE 139 (H) 02/09/2019 0912   BUN 14 06/22/2014 1031   CREATININE 0.91 06/22/2014 1031   CALCIUM 9.2 06/22/2014 1031   GFRNONAA >90 06/22/2014 1031   GFRAA >90 06/22/2014 1031       Component Value Date/Time   WBC 9.1 06/22/2014 1031   RBC 5.52 06/22/2014 1031   HGB 14.6 02/09/2019 0912   HCT 43.0 02/09/2019 0912   PLT 206 06/22/2014 1031   MCV 84.6 06/22/2014 1031   MCH 28.6 06/22/2014 1031   MCHC 33.8 06/22/2014 1031   RDW 13.8 06/22/2014 1031    No results found for: POCLITH, LITHIUM   No results found for: PHENYTOIN, PHENOBARB, VALPROATE, CBMZ   .res Assessment: Plan:    Generalized anxiety disorder  Restless legs syndrome  Insomnia due to mental condition  Steve's symptoms of anxiety and restless legs are well controlled except for a little worsening related to Covid but it is manageable.Marland Kitchen  He is not having any side effects with the medication.  We discussed side effects.  He has been on sertraline 150 mg since late 2017 and has had good control and he does not want any changes.  He uses the clonazepam and Ambien rarely.  We discussed dependence because he was concerned about that.  He is not using it frequently enough to have issues with dependence or withdrawal.  No med changes today  RLS not managed unless ropinirole.  .  We discussed the short-term risks associated with benzodiazepines including sedation and increased fall risk among others.  Discussed long-term side effect risk including dependence, potential withdrawal symptoms, and the potential eventual dose-related risk of dementia.  Follow-up 9 months because patient is stable  Lynder Parents MD, DFAPA  Please see After Visit Summary for patient specific instructions.  No future appointments.  No orders of the defined types were placed in this encounter.     -------------------------------

## 2019-10-30 ENCOUNTER — Other Ambulatory Visit: Payer: Self-pay | Admitting: Psychiatry

## 2019-11-26 IMAGING — MR MRI OF THE LEFT SHOULDER WITHOUT CONTRAST
4 of 5 series · 21 of 40 positions shown · non-contrast
Comparison: None.

CLINICAL DATA: Left shoulder pain, painful range of motion for 4
months

EXAM:
MRI OF THE LEFT SHOULDER WITHOUT CONTRAST
TECHNIQUE: Multiplanar, multisequence MR imaging of the shoulder was performed.
No intravenous contrast was administered.

[Series 6: PD fat-sat · axial · left · 4.0mm · 0.44mm/px · z∈[-73,+34]mm · 8 of 24 slices shown (1 of 2)]
[im 1/24]
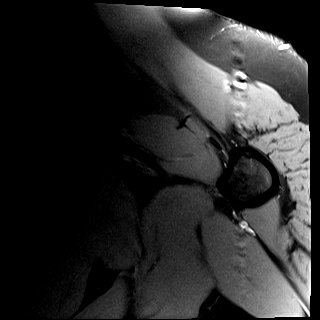
[im 4/24]
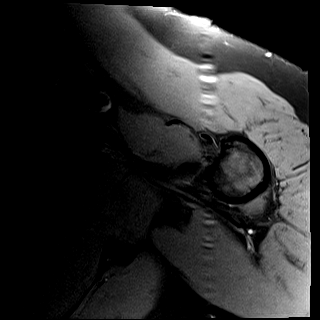
[im 7/24]
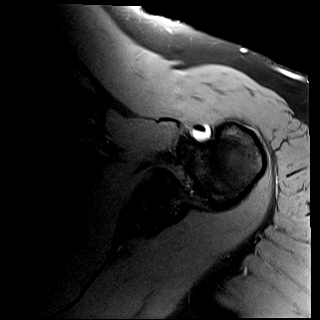
[im 10/24]
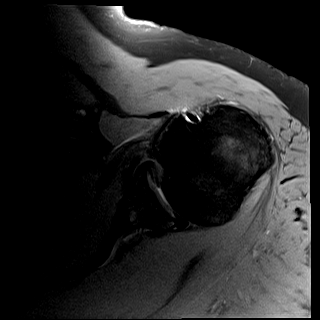
[im 14/24]
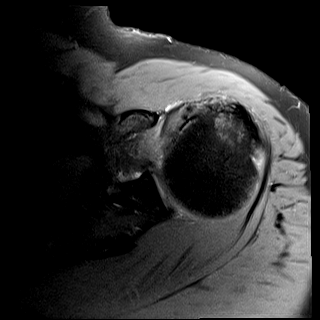
[im 17/24]
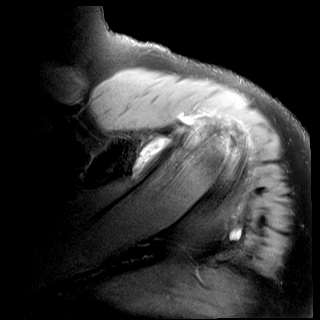
[im 20/24]
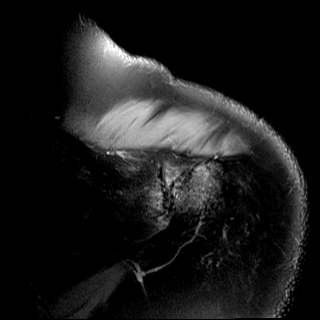
[im 24/24]
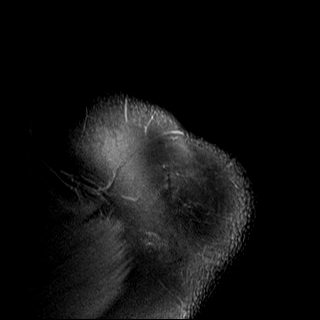

[Series 7: T2 fat-sat · oblique · left · 4.0mm · 0.22mm/px · 3 of 21 slices shown (1 of 2)]
[im 4/21]
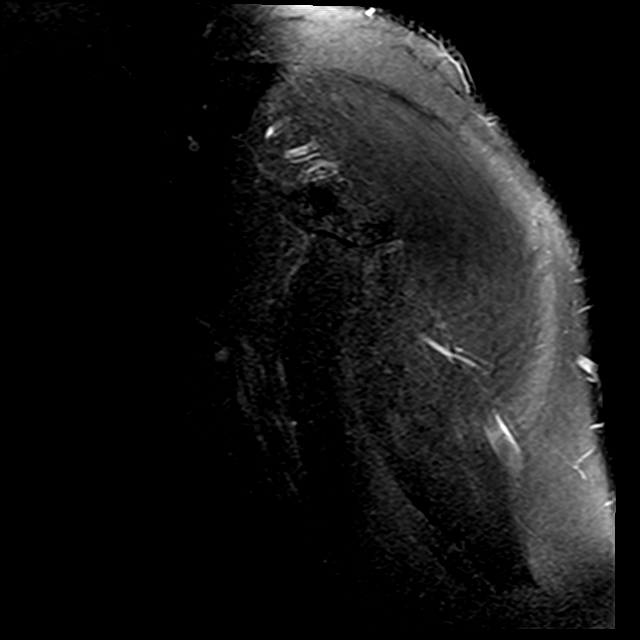
[im 11/21]
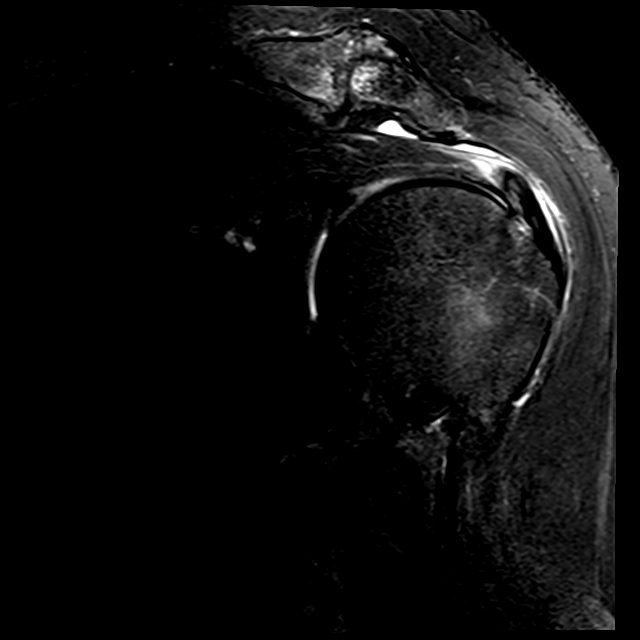
[im 17/21]
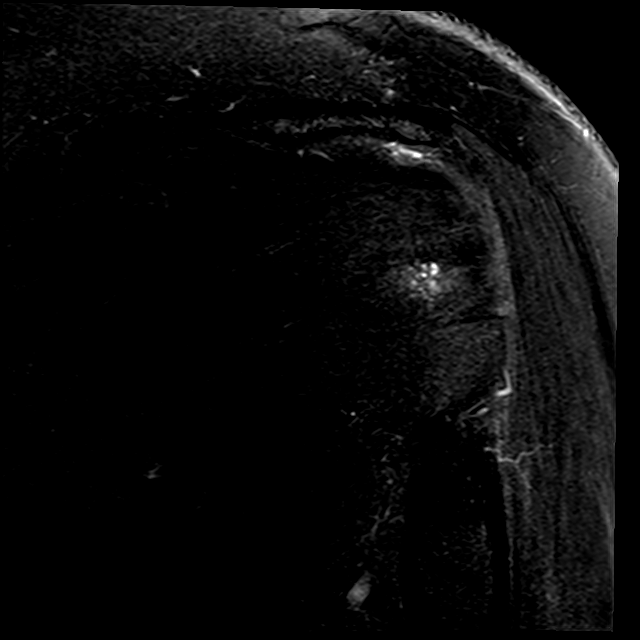

[Series 8: PD fat-sat · oblique · left · 4.0mm · 0.22mm/px · 7 of 21 slices shown (2 of 2)]
[im 1/21]
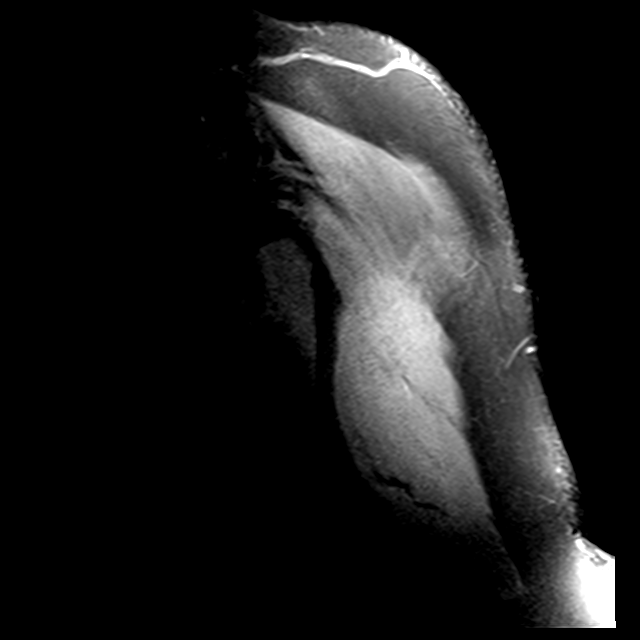
[im 4/21]
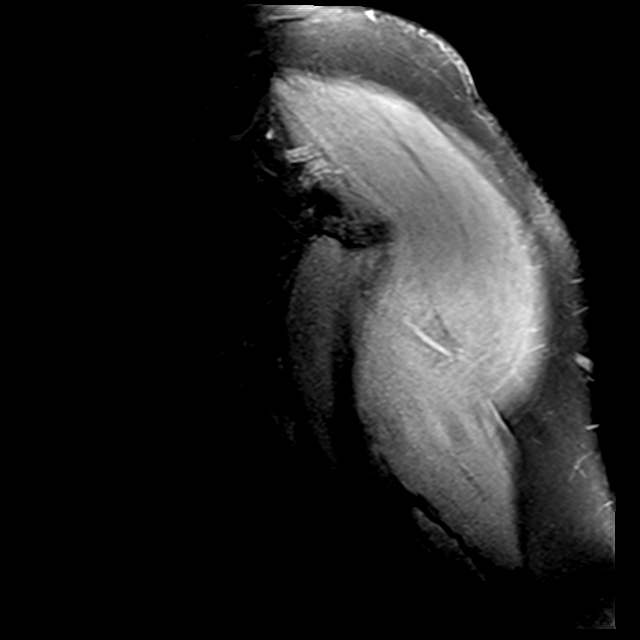
[im 7/21]
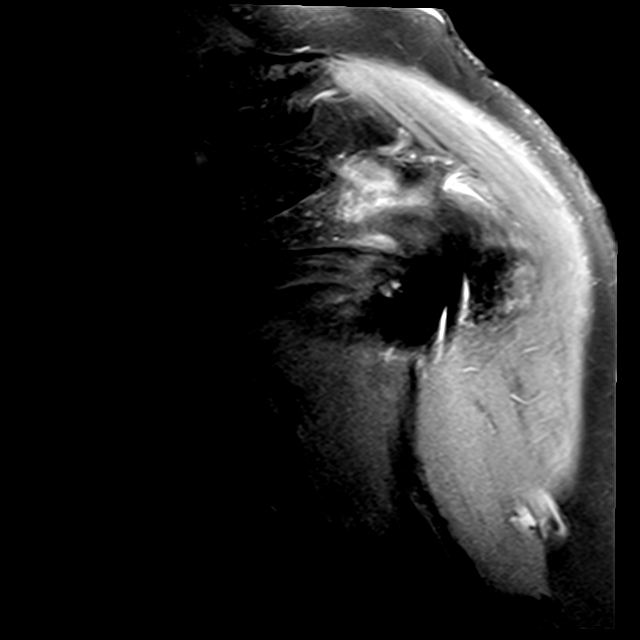
[im 11/21]
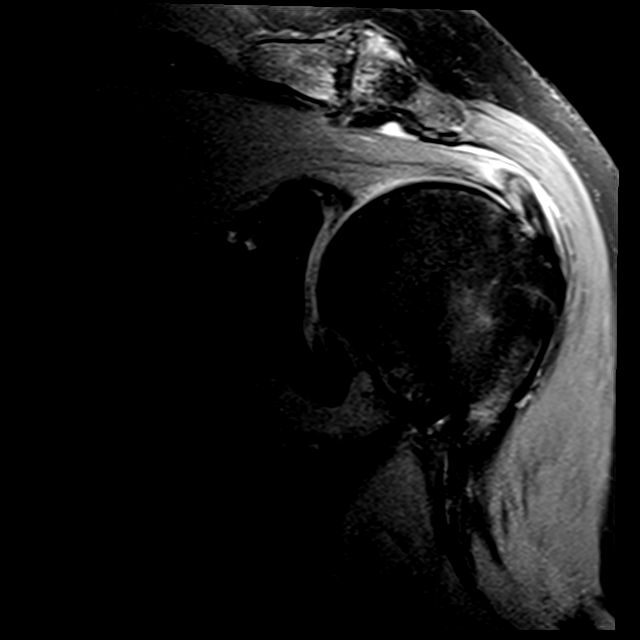
[im 14/21]
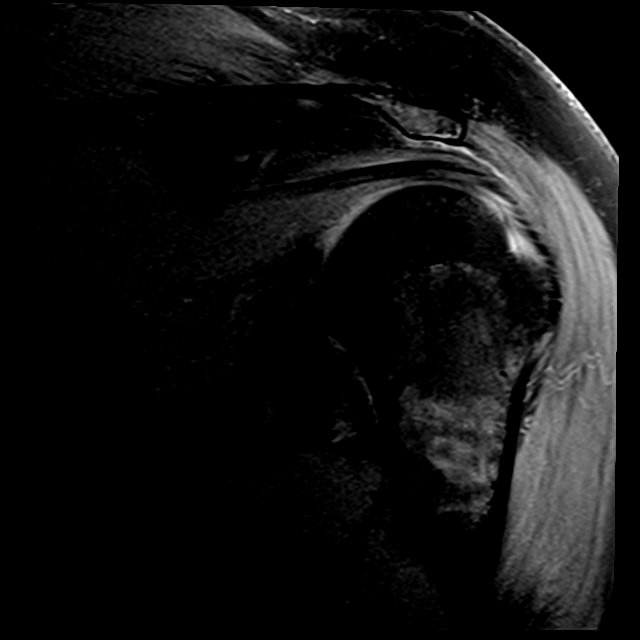
[im 17/21]
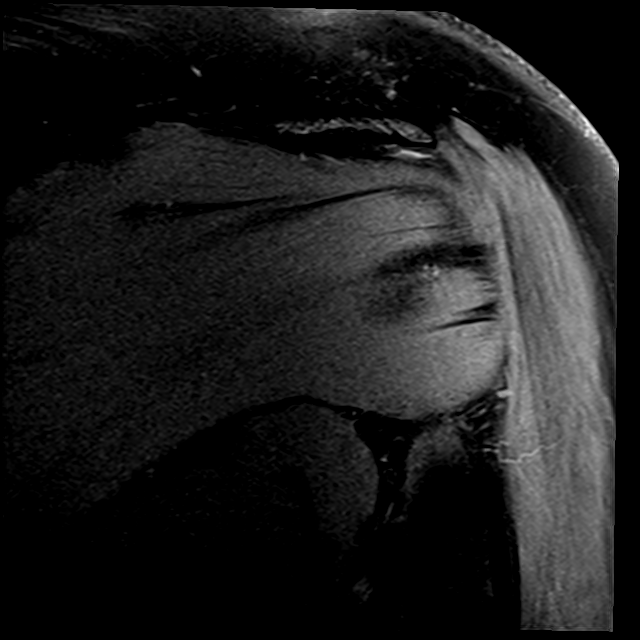
[im 21/21]
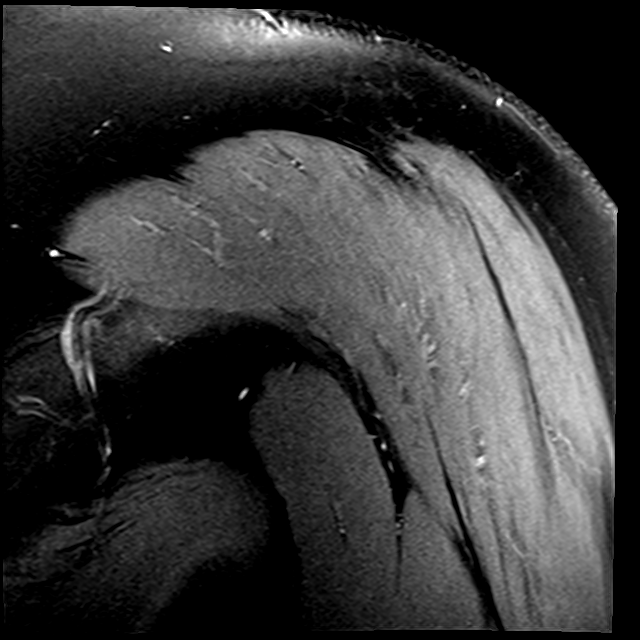

[Series 9: T2 fat-sat · sagittal · left · 4.0mm · 0.44mm/px · 3 of 25 slices shown (2 of 2)]
[im 4/25]
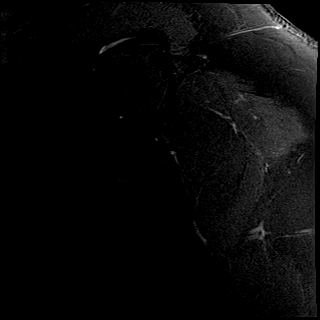
[im 13/25]
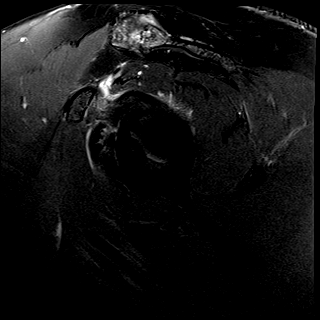
[im 22/25]
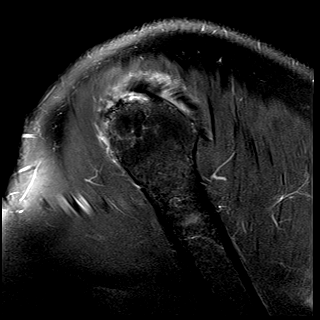

[21 of 40 positions shown; findings below may reference images not displayed]

FINDINGS: Rotator cuff: Severe tendinosis of the supraspinatus tendon with a
small insertional interstitial tear anteriorly and fraying along the
bursal surface. Mild tendinosis of the infraspinatus tendon. Teres
minor tendon is intact. Subscapularis tendon is intact.

Muscles: No atrophy or fatty replacement of nor abnormal signal
within, the muscles of the rotator cuff.

Biceps long head: Mild tendinosis of the intra-articular portion of
the long head of the biceps tendon.

Acromioclavicular Joint: Moderate arthropathy of the
acromioclavicular joint. Type I acromion. Small amount of
subacromial/subdeltoid bursal fluid.

Glenohumeral Joint: No joint effusion.  No chondral defect.

Labrum: Limited evaluation secondary lack of intra-articular
contrast. Degeneration of the superior posterior labrum with a
possible small tear.

Bones: No acute osseous abnormality. No aggressive osseous lesion.
12 mm T2 hyperintense bone lesion with central stippled low signal
in the posterior humeral head likely reflecting a benign chondroid
lesion such as an enchondroma.

Other: No fluid collection or hematoma.
IMPRESSION: 1. Severe tendinosis of the supraspinatus tendon with a small
insertional interstitial tear anteriorly and fraying along the
bursal surface.
2. Mild tendinosis of the infraspinatus tendon.
3. Mild tendinosis of the intra-articular portion of the long head
of the biceps tendon.
4. Mild subacromial/subdeltoid bursitis.

## 2020-01-15 ENCOUNTER — Other Ambulatory Visit: Payer: Self-pay | Admitting: Psychiatry

## 2020-08-31 ENCOUNTER — Encounter: Payer: Self-pay | Admitting: Psychiatry

## 2020-08-31 ENCOUNTER — Telehealth (INDEPENDENT_AMBULATORY_CARE_PROVIDER_SITE_OTHER): Payer: BC Managed Care – PPO | Admitting: Psychiatry

## 2020-08-31 DIAGNOSIS — G2581 Restless legs syndrome: Secondary | ICD-10-CM

## 2020-08-31 DIAGNOSIS — F5105 Insomnia due to other mental disorder: Secondary | ICD-10-CM | POA: Diagnosis not present

## 2020-08-31 DIAGNOSIS — F411 Generalized anxiety disorder: Secondary | ICD-10-CM

## 2020-08-31 MED ORDER — ZOLPIDEM TARTRATE 10 MG PO TABS: 10.0000 mg | ORAL_TABLET | Freq: Every evening | ORAL | 3 refills | Status: AC | PRN

## 2020-08-31 MED ORDER — SERTRALINE HCL 100 MG PO TABS: ORAL_TABLET | ORAL | 1 refills | Status: DC

## 2020-08-31 MED ORDER — ROPINIROLE HCL 0.5 MG PO TABS: 1.0000 mg | ORAL_TABLET | Freq: Every day | ORAL | 3 refills | Status: DC

## 2020-08-31 NOTE — Progress Notes (Signed)
[name] Pacheco [id number] 03/25/1971 50 y.o.   Virtual Visit via Telephone Note  I connected with pt by telephone and verified that I am speaking with the correct person using two identifiers.   I discussed the limitations, risks, security and privacy concerns of performing an evaluation and management service by telephone and the availability of in person appointments. I also discussed with the patient that there may be a patient responsible charge related to this service. The patient expressed understanding and agreed to proceed.  I discussed the assessment and treatment plan with the patient. The patient was provided an opportunity to ask questions and all were answered. The patient agreed with the plan and demonstrated an understanding of the instructions.   The patient was advised to call back or seek an in-person evaluation if the symptoms worsen or if the condition fails to improve as anticipated.  I provided 15 minutes of non-face-to-face time during this encounter. The call started at 930 and ended at 945. The patient was located at home with Covid and the provider was located office.   Subjective:   Patient ID:  [name] Pacheco is a 50 y.o. (DOB 09/04/1970) male.  Chief Complaint:  Chief Complaint  Patient presents with  . Follow-up  . Anxiety  . Sleeping Problem    Anxiety Patient reports no confusion, decreased concentration, nervous/anxious behavior, palpitations or suicidal ideas.    Medication Refill Pertinent negatives include no weakness.   [name] presents to the office today for follow-up of GAD, RLS, sleep.  Vaccinated but got Covid.  Recovering.  Wife got it too and feels worse. Doing Ok with mental health overall with some gray feelings at times. No clonazepam. Anxiety managed. Ambien prn and not often. Sleep 5-6 hours.  Not usually drowsy daytime.  RLS been ok without ropinirole lately but wants it.  Walking more.  Self employed and  limited by Covid.  Wife working.  No recent clonazepam, nor Ambien but occ.  Past Psychiatric Medication Trials: sertraline x many years, clonazepam, ropinirole Remus Loffler  D ADHD on Adderall  Review of Systems:  Review of Systems  Constitutional: Negative for unexpected weight change.  Cardiovascular: Negative for palpitations.       Increase BP  Neurological: Negative for tremors and weakness.  Psychiatric/Behavioral: Negative for agitation, behavioral problems, confusion, decreased concentration, dysphoric mood, hallucinations, self-injury, sleep disturbance and suicidal ideas. The patient is not nervous/anxious and is not hyperactive.     Medications: I have reviewed the patient's current medications.  Current Outpatient Medications  Medication Sig Dispense Refill  . busPIRone (BUSPAR) 10 MG tablet Take 10 mg by mouth 2 (two) times daily.    . cholecalciferol (VITAMIN D3) 25 MCG (1000 UNIT) tablet Take 1,000 Units by mouth daily.    . metoprolol succinate (TOPROL-XL) 50 MG 24 hr tablet Take 50 mg by mouth daily.    Marland Kitchen rOPINIRole (REQUIP) 0.5 MG tablet TAKE 2 TABLETS (1 MG TOTAL) BY MOUTH AT BEDTIME. 180 tablet 3  . sertraline (ZOLOFT) 100 MG tablet TAKE 1.5 TABLETS BY MOUTH DAILY 135 tablet 3  . simvastatin (ZOCOR) 40 MG tablet Take 40 mg by mouth at bedtime.     . tamsulosin (FLOMAX) 0.4 MG CAPS capsule Take 0.4 mg by mouth at bedtime.     Marland Kitchen zolpidem (AMBIEN) 10 MG tablet Take 1 tablet (10 mg total) by mouth at bedtime as needed for sleep. 30 tablet 3  . clonazePAM (KLONOPIN) 0.5 MG tablet Take 1  tablet (0.5 mg total) by mouth 2 (two) times daily as needed for anxiety. (Patient not taking: Reported on 08/31/2020) 30 tablet 5   No current facility-administered medications for this visit.    Medication Side Effects: Other: yawns.  Not that often.  Allergies:  Allergies  Allergen Reactions  . Celebrex [Celecoxib] Itching and Rash    Past Medical History:  Diagnosis Date  .  Anxiety   . Benign localized prostatic hyperplasia with lower urinary tract symptoms (LUTS)   . Biceps tendonitis on left   . Depression   . History of kidney stones   . Hypertension   . OA (osteoarthritis)    left hip, hands  . OSA on CPAP   . Palpitations    02-05-2019  currently pt was seen by dr Leeann Must, cardiologist,  started wearing a 30 day monitor 01-18-2019 until 02-11-2019  . Pre-diabetes   . Precordial chest pain    02-05-2019 per pt had stress echo ordered by dr Leeann Must, cardiologist,  01-18-2019 , pt does not know results  . Restless leg syndrome   . Rotator cuff tear, left     History reviewed. No pertinent family history.  Social History   Socioeconomic History  . Marital status: Single    Spouse name: Not on file  . Number of children: Not on file  . Years of education: Not on file  . Highest education level: Not on file  Occupational History  . Not on file  Tobacco Use  . Smoking status: Never Smoker  . Smokeless tobacco: Never Used  Vaping Use  . Vaping Use: Never used  Substance and Sexual Activity  . Alcohol use: Yes    Comment: rare  . Drug use: Never  . Sexual activity: Not on file  Other Topics Concern  . Not on file  Social History Narrative  . Not on file   Social Determinants of Health   Financial Resource Strain: Not on file  Food Insecurity: Not on file  Transportation Needs: Not on file  Physical Activity: Not on file  Stress: Not on file  Social Connections: Not on file  Intimate Partner Violence: Not on file    Past Medical History, Surgical history, Social history, and Family history were reviewed and updated as appropriate.   Please see review of systems for further details on the patient's review from today.   Objective:   Physical Exam:  There were no vitals taken for this visit.  Physical Exam Neurological:     Mental Status: He is alert and oriented to person, place, and time.     Cranial Nerves: No dysarthria.   Psychiatric:        Attention and Perception: Attention normal.        Mood and Affect: Mood is anxious. Mood is not depressed.        Speech: Speech normal.        Behavior: Behavior is cooperative.        Thought Content: Thought content normal. Thought content is not paranoid or delusional. Thought content does not include homicidal or suicidal ideation. Thought content does not include homicidal or suicidal plan.        Cognition and Memory: Cognition and memory normal.        Judgment: Judgment normal.     Comments: Mild increase in anxiety with stress.     Lab Review:     Component Value Date/Time   NA 139 02/09/2019 0912   K 3.9  02/09/2019 0912   CL 103 06/22/2014 1031   CO2 24 06/22/2014 1031   GLUCOSE 139 (H) 02/09/2019 0912   BUN 14 06/22/2014 1031   CREATININE 0.91 06/22/2014 1031   CALCIUM 9.2 06/22/2014 1031   GFRNONAA >90 06/22/2014 1031   GFRAA >90 06/22/2014 1031       Component Value Date/Time   WBC 9.1 06/22/2014 1031   RBC 5.52 06/22/2014 1031   HGB 14.6 02/09/2019 0912   HCT 43.0 02/09/2019 0912   PLT 206 06/22/2014 1031   MCV 84.6 06/22/2014 1031   MCH 28.6 06/22/2014 1031   MCHC 33.8 06/22/2014 1031   RDW 13.8 06/22/2014 1031    No results found for: POCLITH, LITHIUM   No results found for: PHENYTOIN, PHENOBARB, VALPROATE, CBMZ   .res Assessment: Plan:    Generalized anxiety disorder  Restless legs syndrome  Insomnia due to mental condition  Steve's symptoms of anxiety and restless legs are well controlled except for a little worsening related to Covid but it is manageable.Marland Kitchen  He is not having any side effects with the medication.  We discussed side effects.  He has been on sertraline 150 mg since late 2017 and has had good control and he does not want any changes.  He uses the clonazepam and Ambien rarely.  We discussed dependence because he was concerned about that.  He is not using it frequently enough to have issues with dependence or  withdrawal.  No med changes today Cont sertraline 150 mg Option increase buspirone Continue buspirone if needed  RLS not managed unless ropinirole.  .Continue CPAP.  Option modafinil for focus concerns  We discussed the short-term risks associated with benzodiazepines including sedation and increased fall risk among others.  Discussed long-term side effect risk including dependence, potential withdrawal symptoms, and the potential eventual dose-related risk of dementia.  Follow-up 9 months because patient is stable  Meredith Staggers MD, DFAPA  Please see After Visit Summary for patient specific instructions.  No future appointments.  No orders of the defined types were placed in this encounter.     -------------------------------

## 2021-03-20 ENCOUNTER — Other Ambulatory Visit: Payer: Self-pay | Admitting: Psychiatry

## 2021-03-20 DIAGNOSIS — F411 Generalized anxiety disorder: Secondary | ICD-10-CM

## 2021-09-21 ENCOUNTER — Other Ambulatory Visit: Payer: Self-pay | Admitting: Psychiatry

## 2021-09-21 DIAGNOSIS — F411 Generalized anxiety disorder: Secondary | ICD-10-CM

## 2021-09-24 ENCOUNTER — Telehealth: Payer: Self-pay

## 2021-09-24 NOTE — Telephone Encounter (Signed)
Prior Authorization will also be needed once approved.    Judson Roch- that was 2022

## 2021-09-24 NOTE — Telephone Encounter (Signed)
Pt had an appt on 2/6

## 2021-09-24 NOTE — Telephone Encounter (Signed)
Please call patient to schedule an appt.-

## 2021-09-25 NOTE — Telephone Encounter (Signed)
LVM for pt to call and schedule  °

## 2021-09-25 NOTE — Telephone Encounter (Signed)
Not been seen in a year and has no appt

## 2021-11-26 ENCOUNTER — Other Ambulatory Visit: Payer: Self-pay | Admitting: Psychiatry

## 2021-11-26 DIAGNOSIS — G2581 Restless legs syndrome: Secondary | ICD-10-CM

## 2021-11-27 NOTE — Telephone Encounter (Signed)
Patient last seen 2/22, has not responded to calls to schedule an appt.  ?

## 2022-03-09 ENCOUNTER — Other Ambulatory Visit: Payer: Self-pay | Admitting: Psychiatry

## 2022-03-09 DIAGNOSIS — G2581 Restless legs syndrome: Secondary | ICD-10-CM

## 2022-04-06 ENCOUNTER — Other Ambulatory Visit: Payer: Self-pay | Admitting: Psychiatry

## 2022-04-06 DIAGNOSIS — G2581 Restless legs syndrome: Secondary | ICD-10-CM

## 2022-04-08 NOTE — Telephone Encounter (Signed)
Please schedule appt

## 2022-04-17 NOTE — Telephone Encounter (Signed)
LVM to schedule appt

## 2022-05-29 ENCOUNTER — Other Ambulatory Visit: Payer: Self-pay | Admitting: Psychiatry

## 2022-05-29 DIAGNOSIS — G2581 Restless legs syndrome: Secondary | ICD-10-CM

## 2022-05-29 NOTE — Telephone Encounter (Signed)
Please schedule appt

## 2023-10-24 ENCOUNTER — Ambulatory Visit
Admission: RE | Admit: 2023-10-24 | Discharge: 2023-10-24 | Disposition: A | Discharge status: Home / Self Care | Source: Ambulatory Visit

## 2023-10-24 ENCOUNTER — Other Ambulatory Visit: Payer: Self-pay

## 2023-10-24 VITALS — BP 120/79 | HR 85 | Temp 98.8°F | Resp 16

## 2023-10-24 DIAGNOSIS — J069 Acute upper respiratory infection, unspecified: Secondary | ICD-10-CM | POA: Diagnosis not present

## 2023-10-24 DIAGNOSIS — J40 Bronchitis, not specified as acute or chronic: Secondary | ICD-10-CM

## 2023-10-24 DIAGNOSIS — H66002 Acute suppurative otitis media without spontaneous rupture of ear drum, left ear: Secondary | ICD-10-CM | POA: Diagnosis not present

## 2023-10-24 MED ORDER — PREDNISONE 20 MG PO TABS
ORAL_TABLET | ORAL | 0 refills | Status: AC
Start: 2023-10-24 — End: ?

## 2023-10-24 MED ORDER — AMOXICILLIN-POT CLAVULANATE 875-125 MG PO TABS: 1.0000 | ORAL_TABLET | Freq: Two times a day (BID) | ORAL | 0 refills | Status: AC

## 2023-10-24 NOTE — ED Provider Notes (Signed)
 [facility]    CSN: [id number] Arrival date & time: 10/24/23  0943      History   Chief Complaint Chief Complaint  Patient presents with   Cough   Facial Pain    HPI CORIAN HANDLEY is a 53 y.o. male.   HPI  Patient presents today with concerns for ongoing upper respiratory tract symptoms.  He reports that he has had sinus pain, pressure, nasal congestion as well as productive cough and subjective shortness of breath since last weekend.  He reports he has been taking Sudafed and doing a Nettie pot rinse without notable relief.  He reports his wife was sick last week as well.  He also reports that his cough seems to be triggered by deep inspiration.  He reports some left ear fullness as well as tooth pain.    Past Medical History:  Diagnosis Date   Anxiety    Benign localized prostatic hyperplasia with lower urinary tract symptoms (LUTS)    Biceps tendonitis on left    Depression    History of kidney stones    Hypertension    OA (osteoarthritis)    left hip, hands   OSA on CPAP    Palpitations    02-05-2019  currently pt was seen by dr Leeann Must, cardiologist,  started wearing a 30 day monitor 01-18-2019 until 02-11-2019   Pre-diabetes    Precordial chest pain    02-05-2019 per pt had stress echo ordered by dr Leeann Must, cardiologist,  01-18-2019 , pt does not know results   Restless leg syndrome    Rotator cuff tear, left     Patient Active Problem List   Diagnosis Date Noted   GAD (generalized anxiety disorder) 05/21/2018   Insomnia 05/21/2018   DJD (degenerative joint disease) 07/05/2014    Past Surgical History:  Procedure Laterality Date   EXTRACORPOREAL SHOCK WAVE LITHOTRIPSY  2013 approx   HIP ARTHROSCOPY Right 2005 approx.   SHOULDER ARTHROSCOPY WITH ROTATOR CUFF REPAIR AND SUBACROMIAL DECOMPRESSION Left 02/09/2019   Procedure: LEFT SHOULDER ARTHROSCOPY WITH ROTATOR CUFF REPAIR AND SUBACROMIAL PARTIAL ACROMIOPLASTY, DISTAL CLAVICULECTOMY,  EXTENSIVE DEBRIDEMENT BICEPS TENODENSIS;  Surgeon: Sheral Apley, MD;  Location: [facility];  Service: Orthopedics;  Laterality: Left;   TOTAL HIP ARTHROPLASTY Right 07/05/2014   Procedure: RIGHT TOTAL HIP ARTHROPLASTY ANTERIOR APPROACH;  Surgeon: Sheral Apley, MD;  Location: [facility] OR;  Service: Orthopedics;  Laterality: Right;   WISDOM TOOTH EXTRACTION         Home Medications    Prior to Admission medications   Medication Sig Start Date End Date Taking? Authorizing Provider  amoxicillin-clavulanate (AUGMENTIN) 875-125 MG tablet Take 1 tablet by mouth 2 (two) times daily for 7 days. 10/24/23 10/31/23 Yes Sharma Lawrance E, PA-C  FLUoxetine (PROZAC) 20 MG tablet Take 20 mg by mouth daily.   Yes [provider]  predniSONE (DELTASONE) 20 MG tablet Take 60mg  PO daily x 2 days, then40mg  PO daily x 2 days, then 20mg  PO daily x 3 days 10/24/23  Yes Shanigua Gibb E, PA-C  traZODone (DESYREL) 100 MG tablet Take 100 mg by mouth at bedtime.   Yes [provider]  busPIRone (BUSPAR) 10 MG tablet Take 10 mg by mouth 2 (two) times daily.    [provider]  cholecalciferol (VITAMIN D3) 25 MCG (1000 UNIT) tablet Take 1,000 Units by mouth daily.    [provider]  clonazePAM (KLONOPIN) 0.5 MG tablet Take 1 tablet (0.5 mg total)  by mouth 2 (two) times daily as needed for anxiety. Patient not taking: Reported on 08/31/2020 12/03/18   Lauraine Rinne., MD  metoprolol succinate (TOPROL-XL) 50 MG 24 hr tablet Take 50 mg by mouth daily. 08/03/19   [provider]  rOPINIRole (REQUIP) 0.5 MG tablet TAKE 2 TABLETS (1 MG TOTAL) BY MOUTH AT BEDTIME. 11/29/21   Cottle, Steva Ready., MD  sertraline (ZOLOFT) 100 MG tablet TAKE 1 AND 1/2 TABLETS BY MOUTH EVERY DAY 03/21/21   Cottle, Steva Ready., MD  simvastatin (ZOCOR) 40 MG tablet Take 40 mg by mouth at bedtime.     [provider]  tamsulosin (FLOMAX) 0.4 MG CAPS capsule Take 0.4 mg by mouth at bedtime.      [provider]  zolpidem (AMBIEN) 10 MG tablet Take 1 tablet (10 mg total) by mouth at bedtime as needed for sleep. 08/31/20   Cottle, Steva Ready., MD    Family History History reviewed. No pertinent family history.  Social History Social History   Tobacco Use   Smoking status: Never   Smokeless tobacco: Never  Vaping Use   Vaping status: Never Used  Substance Use Topics   Alcohol use: Yes    Comment: rare   Drug use: Never     Allergies   Celebrex [celecoxib]   Review of Systems Review of Systems  Constitutional:  Positive for fever (subjective). Negative for chills.  HENT:  Positive for congestion, ear pain (left ear fullness), sinus pressure and sinus pain. Negative for ear discharge, facial swelling, postnasal drip and sore throat.   Respiratory:  Positive for cough and shortness of breath. Negative for chest tightness and wheezing.   Gastrointestinal:  Negative for diarrhea, nausea and vomiting.  Musculoskeletal:  Negative for myalgias.  Neurological:  Positive for headaches. Negative for dizziness and light-headedness.     Physical Exam Triage Vital Signs ED Triage Vitals  Encounter Vitals Group     BP 10/24/23 0949 120/79     Systolic BP Percentile --      Diastolic BP Percentile --      Pulse Rate 10/24/23 0949 85     Resp 10/24/23 0949 16     Temp 10/24/23 0949 98.8 F (37.1 C)     Temp src --      SpO2 10/24/23 0949 96 %     Weight --      Height --      Head Circumference --      Peak Flow --      Pain Score 10/24/23 0952 1     Pain Loc --      Pain Education --      Exclude from Growth Chart --    No data found.  Updated Vital Signs BP 120/79   Pulse 85   Temp 98.8 F (37.1 C)   Resp 16   SpO2 96%   Visual Acuity Right Eye Distance:   Left Eye Distance:   Bilateral Distance:    Right Eye Near:   Left Eye Near:    Bilateral Near:     Physical Exam Vitals reviewed.  Constitutional:      General: He is awake.      Appearance: Normal appearance. He is well-developed and well-groomed.  HENT:     Head: Normocephalic and atraumatic.     Right Ear: Hearing, tympanic membrane and ear canal normal.     Left Ear: Hearing and ear canal normal. Tympanic membrane  is erythematous and bulging. Tympanic membrane is not injected, scarred, perforated or retracted.     Ears:     Comments: Patient has bulging and erythematous left tympanic membrane.  This does not appear to be purulent but TM is rather opaque.    Mouth/Throat:     Lips: Pink.     Mouth: Mucous membranes are moist.     Pharynx: Oropharynx is clear. Uvula midline. No pharyngeal swelling, oropharyngeal exudate, posterior oropharyngeal erythema, uvula swelling or postnasal drip.     Tonsils: No tonsillar exudate or tonsillar abscesses.  Eyes:     General: Lids are normal. Gaze aligned appropriately.     Extraocular Movements: Extraocular movements intact.  Cardiovascular:     Rate and Rhythm: Normal rate and regular rhythm.     Heart sounds: Normal heart sounds.  Pulmonary:     Effort: Pulmonary effort is normal.     Breath sounds: Normal breath sounds. Decreased air movement present. No decreased breath sounds, wheezing, rhonchi or rales.     Comments: Patient has notable decreased air movement as well as fine wheezes globally. Musculoskeletal:     Cervical back: Normal range of motion and neck supple.  Lymphadenopathy:     Head:     Right side of head: No submental, submandibular or preauricular adenopathy.     Left side of head: No submental, submandibular or preauricular adenopathy.     Cervical:     Right cervical: No superficial cervical adenopathy.    Left cervical: No superficial cervical adenopathy.     Upper Body:     Right upper body: No supraclavicular adenopathy.     Left upper body: No supraclavicular adenopathy.  Skin:    General: Skin is warm and dry.  Neurological:     General: No focal deficit present.     Mental Status: He  is alert and oriented to person, place, and time.  Psychiatric:        Mood and Affect: Mood normal.        Behavior: Behavior normal. Behavior is cooperative.        Thought Content: Thought content normal.        Judgment: Judgment normal.      UC Treatments / Results  Labs (all labs ordered are listed, but only abnormal results are displayed) Labs Reviewed - No data to display  EKG   Radiology No results found.  Procedures Procedures (including critical care time)  Medications Ordered in UC Medications - No data to display  Initial Impression / Assessment and Plan / UC Course  I have reviewed the triage vital signs and the nursing notes.  Pertinent labs & imaging results that were available during my care of the patient were reviewed by me and considered in my medical decision making (see chart for details).      Final Clinical Impressions(s) / UC Diagnoses   Final diagnoses:  Upper respiratory tract infection, unspecified type  Bronchitis  Acute suppurative otitis media of left ear without spontaneous rupture of tympanic membrane, recurrence not specified    Patient presents today with concerns for ongoing nasal congestion, ear pain and fullness, productive cough as well as subjective shortness of breath.  Physical exam was notable for bulging, erythematous left tympanic membrane, decreased air movement on lung auscultation as well as fine wheezes throughout.  Vitals are overall reassuring at this time.  Given his symptoms and chronicity of illness I suspect likely upper respiratory tract infection/sinusitis along with bronchitis  and acute suppurative otitis media of the left ear.  Reviewed with him that we will send in Augmentin p.o. twice daily x 7 days as this should provide adequate coverage for most sinusitis as well as otitis media bacterial etiologies.  I am suspicious that he is developing bronchitis so we will send in prednisone taper to assist with this.   Recommend over-the-counter medications as needed for symptomatic relief.  ED and return cautions reviewed and provided after visit summary.  Follow-up as needed for progressing or persistent symptoms    Discharge Instructions      You were seen today for concerns for ongoing nasal congestion, runny nose, ear pain.  Your left ear appears to be developing an ear infection.  The fact that you are sinus congestion symptoms have been ongoing for so long as well as the presence of potential ear infection has led me to believe that you likely have a sinus infection.  I have sent in a medication called Augmentin for you to take twice per day for 7 days.  This is an antibiotic and should treat typical bacterial causes of sinus infection as well as ear infection. Given your persistent cough and decreased air movement in your lungs I am suspicious that you might be developing bronchitis.  I have sent in a medication called prednisone for you to take as directed for 7 days.  This is a steroid that typically helps reduce inflammation and improve breathing. I have sent in a script for Prednisone taper to be taken in the morning with breakfast per the instructions on the container Remember that steroids can cause sleeplessness, irritability, increased hunger and elevated glucose levels so be mindful of these side effects. They should lessen as you progress to the lower doses of the taper. You can take over-the-counter medications such as regular formulation Mucinex and Robitussin as well as Tylenol and ibuprofen as needed for pain management.  If you start to develop fevers are not responding to medication, significant sinus pressure, difficulty breathing, chest pain, worsening symptoms please return here or go to the emergency room for further evaluation management.     ED Prescriptions     Medication Sig Dispense Auth. Provider   amoxicillin-clavulanate (AUGMENTIN) 875-125 MG tablet Take 1 tablet by mouth 2  (two) times daily for 7 days. 14 tablet Cree Kunert E, PA-C   predniSONE (DELTASONE) 20 MG tablet Take 60mg  PO daily x 2 days, then40mg  PO daily x 2 days, then 20mg  PO daily x 3 days 13 tablet Oshae Simmering E, PA-C      PDMP not reviewed this encounter.   Providence Crosby, PA-C 10/24/23 1044

## 2023-10-24 NOTE — Discharge Instructions (Addendum)
 You were seen today for concerns for ongoing nasal congestion, runny nose, ear pain.  Your left ear appears to be developing an ear infection.  The fact that you are sinus congestion symptoms have been ongoing for so long as well as the presence of potential ear infection has led me to believe that you likely have a sinus infection.  I have sent in a medication called Augmentin for you to take twice per day for 7 days.  This is an antibiotic and should treat typical bacterial causes of sinus infection as well as ear infection. Given your persistent cough and decreased air movement in your lungs I am suspicious that you might be developing bronchitis.  I have sent in a medication called prednisone for you to take as directed for 7 days.  This is a steroid that typically helps reduce inflammation and improve breathing. I have sent in a script for Prednisone taper to be taken in the morning with breakfast per the instructions on the container Remember that steroids can cause sleeplessness, irritability, increased hunger and elevated glucose levels so be mindful of these side effects. They should lessen as you progress to the lower doses of the taper. You can take over-the-counter medications such as regular formulation Mucinex and Robitussin as well as Tylenol and ibuprofen as needed for pain management.  If you start to develop fevers are not responding to medication, significant sinus pressure, difficulty breathing, chest pain, worsening symptoms please return here or go to the emergency room for further evaluation management.

## 2023-10-24 NOTE — ED Triage Notes (Signed)
 Has been ill since about last Sunday, has had c/o sinus pressure/congestion and cough. No fever. Has taken sudafed.
